# Patient Record
Sex: Female | Born: 1935 | Race: White | Hispanic: No | Smoking: Never smoker
Health system: Southern US, Community
[De-identification: ages and names within clinical notes are randomized; demographics above are authoritative.]

## PROBLEM LIST (undated history)

## (undated) DIAGNOSIS — I219 Acute myocardial infarction, unspecified: Secondary | ICD-10-CM

## (undated) DIAGNOSIS — K5790 Diverticulosis of intestine, part unspecified, without perforation or abscess without bleeding: Secondary | ICD-10-CM

## (undated) DIAGNOSIS — I214 Non-ST elevation (NSTEMI) myocardial infarction: Secondary | ICD-10-CM

## (undated) DIAGNOSIS — R51 Headache: Secondary | ICD-10-CM

## (undated) DIAGNOSIS — E7800 Pure hypercholesterolemia, unspecified: Secondary | ICD-10-CM

## (undated) DIAGNOSIS — F0390 Unspecified dementia without behavioral disturbance: Secondary | ICD-10-CM

## (undated) DIAGNOSIS — I10 Essential (primary) hypertension: Secondary | ICD-10-CM

## (undated) DIAGNOSIS — N189 Chronic kidney disease, unspecified: Secondary | ICD-10-CM

## (undated) DIAGNOSIS — K219 Gastro-esophageal reflux disease without esophagitis: Secondary | ICD-10-CM

## (undated) DIAGNOSIS — I2510 Atherosclerotic heart disease of native coronary artery without angina pectoris: Secondary | ICD-10-CM

## (undated) DIAGNOSIS — I495 Sick sinus syndrome: Secondary | ICD-10-CM

## (undated) DIAGNOSIS — I119 Hypertensive heart disease without heart failure: Secondary | ICD-10-CM

## (undated) DIAGNOSIS — R519 Headache, unspecified: Secondary | ICD-10-CM

## (undated) HISTORY — PX: COLECTOMY: SHX59

## (undated) HISTORY — DX: Acute myocardial infarction, unspecified: I21.9

## (undated) HISTORY — DX: Gastro-esophageal reflux disease without esophagitis: K21.9

## (undated) HISTORY — PX: CHOLECYSTECTOMY: SHX55

## (undated) HISTORY — DX: Non-ST elevation (NSTEMI) myocardial infarction: I21.4

## (undated) HISTORY — PX: VASCULAR SURGERY: SHX849

## (undated) HISTORY — DX: Hypertensive heart disease without heart failure: I11.9

## (undated) HISTORY — DX: Atherosclerotic heart disease of native coronary artery without angina pectoris: I25.10

## (undated) HISTORY — DX: Headache, unspecified: R51.9

## (undated) HISTORY — DX: Headache: R51

## (undated) HISTORY — PX: KNEE SURGERY: SHX244

## (undated) HISTORY — DX: Chronic kidney disease, unspecified: N18.9

## (undated) HISTORY — DX: Pure hypercholesterolemia, unspecified: E78.00

## (undated) HISTORY — DX: Diverticulosis of intestine, part unspecified, without perforation or abscess without bleeding: K57.90

---

## 1997-12-14 ENCOUNTER — Emergency Department (HOSPITAL_COMMUNITY): Admission: EM | Admit: 1997-12-14 | Discharge: 1997-12-14 | Payer: Self-pay | Admitting: Emergency Medicine

## 1997-12-15 ENCOUNTER — Encounter: Payer: Self-pay | Admitting: Emergency Medicine

## 1999-08-09 ENCOUNTER — Encounter: Admission: RE | Admit: 1999-08-09 | Discharge: 1999-08-09 | Payer: Self-pay | Admitting: Endocrinology

## 1999-08-09 ENCOUNTER — Encounter: Payer: Self-pay | Admitting: Endocrinology

## 2000-03-16 ENCOUNTER — Ambulatory Visit (HOSPITAL_COMMUNITY): Admission: RE | Admit: 2000-03-16 | Discharge: 2000-03-16 | Payer: Self-pay | Admitting: Internal Medicine

## 2000-08-09 ENCOUNTER — Encounter: Payer: Self-pay | Admitting: Internal Medicine

## 2000-08-09 ENCOUNTER — Encounter: Admission: RE | Admit: 2000-08-09 | Discharge: 2000-08-09 | Payer: Self-pay | Admitting: Internal Medicine

## 2001-07-01 ENCOUNTER — Encounter: Payer: Self-pay | Admitting: Emergency Medicine

## 2001-07-01 ENCOUNTER — Emergency Department (HOSPITAL_COMMUNITY): Admission: EM | Admit: 2001-07-01 | Discharge: 2001-07-01 | Payer: Self-pay | Admitting: Emergency Medicine

## 2001-08-01 ENCOUNTER — Encounter: Admission: RE | Admit: 2001-08-01 | Discharge: 2001-08-01 | Payer: Self-pay | Admitting: Internal Medicine

## 2001-08-01 ENCOUNTER — Encounter: Payer: Self-pay | Admitting: Internal Medicine

## 2002-01-23 HISTORY — PX: PACEMAKER INSERTION: SHX728

## 2002-08-07 ENCOUNTER — Encounter: Admission: RE | Admit: 2002-08-07 | Discharge: 2002-08-07 | Payer: Self-pay | Admitting: Internal Medicine

## 2002-08-07 ENCOUNTER — Encounter: Payer: Self-pay | Admitting: Internal Medicine

## 2002-09-07 ENCOUNTER — Encounter: Payer: Self-pay | Admitting: Emergency Medicine

## 2002-09-07 ENCOUNTER — Inpatient Hospital Stay (HOSPITAL_COMMUNITY): Admission: EM | Admit: 2002-09-07 | Discharge: 2002-09-11 | Payer: Self-pay | Admitting: Emergency Medicine

## 2002-09-09 HISTORY — PX: CARDIAC CATHETERIZATION: SHX172

## 2002-10-10 ENCOUNTER — Inpatient Hospital Stay (HOSPITAL_COMMUNITY): Admission: EM | Admit: 2002-10-10 | Discharge: 2002-10-11 | Payer: Self-pay | Admitting: Emergency Medicine

## 2002-10-10 ENCOUNTER — Encounter: Payer: Self-pay | Admitting: Emergency Medicine

## 2002-10-19 ENCOUNTER — Encounter: Payer: Self-pay | Admitting: Cardiology

## 2002-10-19 ENCOUNTER — Inpatient Hospital Stay (HOSPITAL_COMMUNITY): Admission: EM | Admit: 2002-10-19 | Discharge: 2002-10-25 | Payer: Self-pay | Admitting: Emergency Medicine

## 2002-10-19 ENCOUNTER — Encounter: Payer: Self-pay | Admitting: Emergency Medicine

## 2004-04-01 ENCOUNTER — Emergency Department (HOSPITAL_COMMUNITY): Admission: EM | Admit: 2004-04-01 | Discharge: 2004-04-01 | Payer: Self-pay | Admitting: Emergency Medicine

## 2005-08-03 ENCOUNTER — Encounter: Admission: RE | Admit: 2005-08-03 | Discharge: 2005-08-03 | Payer: Self-pay | Admitting: Neurology

## 2006-01-25 ENCOUNTER — Emergency Department (HOSPITAL_COMMUNITY): Admission: EM | Admit: 2006-01-25 | Discharge: 2006-01-25 | Payer: Self-pay | Admitting: Emergency Medicine

## 2010-02-11 ENCOUNTER — Inpatient Hospital Stay (HOSPITAL_COMMUNITY)
Admission: EM | Admit: 2010-02-11 | Discharge: 2010-02-13 | Payer: Self-pay | Source: Home / Self Care | Attending: Internal Medicine | Admitting: Internal Medicine

## 2010-02-12 NOTE — H&P (Signed)
Felicia West, Felicia West NO.:  [id number]  MEDICAL RECORD NO.:  [id number]          PATIENT TYPE:  EMS  LOCATION:  ED                           FACILITY:  [facility]  PHYSICIAN:  Ruthy Dick, MD    DATE OF BIRTH:  [birth year]  DATE OF ADMISSION:  02/11/2010 DATE OF DISCHARGE:                             HISTORY & PHYSICAL   The patient is seen and examined in the emergency room.  Primary care physician is Dr. Georgann Housekeeper.  REASON FOR ADMISSION:  Mild altered mental status, generalized weakness, and nausea today.  HISTORY OF PRESENTING ILLNESS:  Felicia West is a pleasant 77 year old lady with past medical history significant for dyslipidemia, hypertension, history of MI, and pacemaker, who was brought to the hospital today because she started feeling bad at home.  Basically, the patient's son says that the patient called her around early morning and said that around 4 a.m., she started feeling very bad.  She was not very sure about the reason she was feeling bad and the son said that he went over to her house immediately and at the house, the son thought that the patient was a little bit confused and was not looking very good. Because of this, the son called the EMS.  During this time, the patient admitted to having some nausea and general abdominal discomfort, but no pain.  In any case, prior to the arrival of the EMS vehicle, the patient had a very large bowel movement and at that time noted that she was beginning to feel good.  Confusion was still relatively there, but she had no weakness whatsoever and she had no issues with speech.  Her speech was clear and the son said that the patient actually walked to the EMS vehicle and was taken to the hospital.  Here in the hospital, the patient had another bowel movement and said that she was beginning to feel better.  Her condition also started improving according to the son.  Here in the emergency room, she had 2  episodes of hypotension with systolic blood pressure being around 80.  Of note is the fact that with all this going on, the patient did not have any chest pain and no shortness of breath.  Again, she denies having any abdominal pain.  When asked specifically today, she does not know exactly what year it is, but she knows where she is.  The son says that, that happens from time to time.  The patient says that she has had her good days and her bad days. The patient, however, lives alone and I discussed this with the family members and they said the patient has been very persistent that she would like to live independently.  A CAT scan of the abdomen was done because of the persistent nausea and the patient was found to have possible colitis, ischemic versus inflammatory versus infectious.  The patient has not had any diarrhea, even though she had 2 bowel movements today and abdomen is not tender.  PAST MEDICAL HISTORY: 1. Dyslipidemia. 2. History of a permanent pacemaker. 3. Hypertension. 4. Myocardial infarction. 5.  Questionable dementia.  The son says in the last 3 months, the     patient's memory has been going down gradually.  SOCIAL HISTORY:  The patient lives alone.  Denies tobacco, alcohol, and illicit drug use.  MEDICATIONS:  The patient is on: 1. Mirtazapine 15 mg daily at bedtime as needed. 2. Tramadol 50 mg every 6 hours as needed for pain. 3. Norvasc 5 mg every evening. 4. Avapro 300 mg every evening. 5. Vitamin D3 1000 units p.o. daily. 6. Metoprolol XL 25 mg daily. 7. Calcium 600 mg daily. 8. Vitamin E 400 international units daily. 9. Aspirin enteric-coated 325 mg daily.  ALLERGIES: 1. PENICILLIN. 2. LIDOCAINE. 3. PROCAINE. 4. CODEINE. 5. FOOD ALLERGY. 6. DAIRY ALLERGY. 7. EGG ALLERGY.  REVIEW OF SYSTEMS:  All systems reviewed, but negative except noted in history of presenting illness.  Additionally, the patient denies syncope, palpitations, diplopia,  photophobia.  The patient also denies dysuria, frequency, and urgency.  Denies melenic stool and hematochezia. Denies vomiting, but did have nausea as previously noted.  No fever, no chills.  No new skin rash.  FAMILY HISTORY:  Noncontributory.  The patient is unable to remember exactly medical problems for the family members.  PHYSICAL EXAMINATION:  GENERAL:  The patient seen and examined in the emergency room.  She is oriented to place and person but not to time, but again this seems to be almost baseline for her.  She knows who the president of this country is. VITAL SIGNS:  Temperature is 97.4, pulse 72-110, respiration 18, blood pressure 122/48, saturating 96% on room air.  Again of note is the fact that at one point during this admission, she had a systolic blood pressure of around 84. HEENT:  Normocephalic, atraumatic.  Pupils equal, round, reactive to light.  Extraocular muscles intact.  Nares patent. NECK:  Supple.  No JVD.  No lymphadenopathy.  No thyromegaly. CHEST:  Clear to auscultation bilaterally.  No rhonchi, no rales, no wheezing. ABDOMEN:  Soft, nontender.  Normal bowel sounds.  No hepatosplenomegaly. EXTREMITIES:  No clubbing or cyanosis.  No edema. CARDIOVASCULAR:  First and second heart sounds only. CENTRAL NERVOUS SYSTEM:  No obvious focal deficits noted.  LABORATORY DATA AND INVESTIGATIONS:  WBC 10.8, hemoglobin 12, hematocrit 37, platelets 164, and 81% neutrophils.  Glucose 142.  Sodium 140, potassium 4.3, BUN 23, creatinine 1.4.  Urinalysis shows no evidence for urinary tract infection.  Lactic acid is 3.9.  CT scan of the abdomen and pelvis shows limited examination without IV contrast.  However, there is diffuse mild circumferential wall thickening seen in the distal sigmoid colon with proximal rectum suggesting flexion, infectious, inflammatory, or ischemic changes.  There was also a hiatal hernia and neurolytic or disease progress may be related to  underdistention. Consideration for esophagram was suggested.  I would defer this to the attending physician in the morning.  Next, there was incomplete evaluated hypoattenuating lesion in the right hepatic lobe and right kidney.  Non-imaging ultrasound may be of use for further evaluation. EKG showed pacemaker spikes with no acute ST abnormalities.  ASSESSMENT: 1. Transient altered mental status, improved, etiology unclear. 2. Generalized weakness, improving. 3. Colitis, ischemic versus inflammatory. 4. Transient hypotension. 5. Dehydration. 6. Dyslipidemia. 7. History of a permanent pacemaker placement. 8. History of hypertension. 9. History of myocardial infarction. 10.Questionable dementia. 11.Right hepatic lobe and right kidney lesions, query etiology.  PLAN OF CARE:  The patient will be admitted to the hospital and we will hydrate the patient  judiciously.  We will place on telemetry because of history of hypotension.  We will start IV antibiotics, Cipro and Flagyl for the patient's colitis.  We would defer to rounding attending or Dr. Donette Larry to decide whether to consult Gastroenterology as far as the colitis is concerned.  We will also defer to them to also decide on how to approach the patient's hiatal hernia and the underdistention of the esophagus and also whether an esophagram will be warranted.  We will also defer to the patient's primary care physician or rounding attending to also address the patient's right hepatic lobe lesion and right kidney lesion since a nonemergent ultrasound was recommended.  Discussed with the patient and her family members that the patient is DNI/DNR and has a living will to this effect.  Plan of care discussed with them at length and they are in agreement and plan to comply.  Total time used on the patient is 1 hour.     Ruthy Dick, MD     GU/MEDQ  D:  02/11/2010  T:  02/11/2010  Job:  [id number]  cc:   Georgann Housekeeper, MD Fax:  [phone]  Electronically Signed by Ruthy Dick  on 02/12/2010 11:23:23 AM

## 2010-02-14 LAB — DIFFERENTIAL
Basophils Relative: 0 % (ref 0–1)
Lymphocytes Relative: 10 % — ABNORMAL LOW (ref 12–46)
Lymphs Abs: 1.1 10*3/uL (ref 0.7–4.0)
Monocytes Relative: 8 % (ref 3–12)
Neutro Abs: 8.8 10*3/uL — ABNORMAL HIGH (ref 1.7–7.7)
Neutrophils Relative %: 81 % — ABNORMAL HIGH (ref 43–77)

## 2010-02-14 LAB — CBC
MCHC: 32.7 g/dL (ref 30.0–36.0)
MCHC: 33.1 g/dL (ref 30.0–36.0)
MCV: 89.5 fL (ref 78.0–100.0)
Platelets: 164 10*3/uL (ref 150–400)
Platelets: 141 10*3/uL — ABNORMAL LOW (ref 150–400)
RDW: 13.3 % (ref 11.5–15.5)
RDW: 13.1 % (ref 11.5–15.5)
WBC: 10.8 10*3/uL — ABNORMAL HIGH (ref 4.0–10.5)
WBC: 9.8 10*3/uL (ref 4.0–10.5)

## 2010-02-14 LAB — BASIC METABOLIC PANEL
BUN: 17 mg/dL (ref 6–23)
CO2: 26 mEq/L (ref 19–32)
Chloride: 106 mEq/L (ref 96–112)
Glucose, Bld: 152 mg/dL — ABNORMAL HIGH (ref 70–99)
Potassium: 4.3 mEq/L (ref 3.5–5.1)

## 2010-02-14 LAB — URINALYSIS, ROUTINE W REFLEX MICROSCOPIC
Hgb urine dipstick: NEGATIVE
Protein, ur: NEGATIVE mg/dL
Urobilinogen, UA: 0.2 mg/dL (ref 0.0–1.0)

## 2010-02-14 LAB — HEPATIC FUNCTION PANEL
AST: 24 U/L (ref 0–37)
Albumin: 3 g/dL — ABNORMAL LOW (ref 3.5–5.2)
Alkaline Phosphatase: 76 U/L (ref 39–117)
Total Bilirubin: 0.9 mg/dL (ref 0.3–1.2)

## 2010-02-14 LAB — POCT I-STAT, CHEM 8
Calcium, Ion: 1.06 mmol/L — ABNORMAL LOW (ref 1.12–1.32)
HCT: 37 % (ref 36.0–46.0)
Hemoglobin: 12.6 g/dL (ref 12.0–15.0)
Sodium: 140 mEq/L (ref 135–145)
TCO2: 26 mmol/L (ref 0–100)

## 2010-02-14 LAB — POCT CARDIAC MARKERS
CKMB, poc: 1 ng/mL (ref 1.0–8.0)
Myoglobin, poc: 193 ng/mL (ref 12–200)

## 2010-02-15 LAB — URINE CULTURE
Colony Count: NO GROWTH
Culture: NO GROWTH

## 2010-03-01 NOTE — Discharge Summary (Signed)
NAMEJONA, ERKKILA                ACCOUNT NO.:  [id number]  MEDICAL RECORD NO.:  [id number]          PATIENT TYPE:  INP  LOCATION:  1436                         FACILITY:  [facility]  PHYSICIAN:  Rosalyn Gess. Laural Eiland, MD  DATE OF BIRTH:  1916-11-29  DATE OF ADMISSION:  02/11/2010 DATE OF DISCHARGE:  02/13/2010                              DISCHARGE SUMMARY   ADMITTING DIAGNOSES: 1. Transient altered mental status. 2. Generalized weakness. 3. Colitis. 4. Transient hypotension. 5. Dehydration. 6. Dyslipidemia. 7. Sick sinus syndrome with permanent pacemaker in place. 8. Hypertension. 9. History of myocardial infarction. 10.Question of mild early dementia. 11.Right hepatic lobe and right kidney lesion.  DISCHARGE DIAGNOSES: 1. Change in altered mental status. 2. Generalized weakness. 3. Colitis. 4. Change in hypotension. 5. Dehydration. 6. Dyslipidemia. 7. Sick sinus syndrome with permanent pacemaker in place. 8. Hypertension. 9. History of myocardial infarction. 10.Question of mild early dementia. 11.Right hepatic lobe and right kidney lesion.  CONSULTANTS:  None.  PROCEDURES: 1. Acute abdominal film January 20 which showed stable chronic     findings with no acute abnormality. 2. CT of the head without contrast which revealed chronic     microvascular disease with no acute abnormalities. 3. CT scan of the abdomen and pelvis which revealed diffuse mild     circumferential wall thickening in the distal sigmoid colon,     proximal rectum suggesting inflammation or infection.  The patient     had hiatal hernia with nodular thickening of the distal esophagus.     Incompletely evaluated hypoattenuating lesions in the right hepatic     lobe and right kidney.  Nonemergent ultrasound is considered a     reasonable follow-up study.  HISTORY OF PRESENT ILLNESS:  Ms. Sayegh is a 77 year old woman who lives independently, but her family looks in her on regular basis.  She is  followed for dyslipidemia, hypertension, CAD, sick sinus syndrome with a history of pacemaker.  The patient's son was a historian for her admission.  Early in the a.m. of the day of admission, she started feeling bad.  She was nonspecific in regards to her complaints.  Her son felt that she was a bit confused, more than usual and was looking ill. So later in the day, the son called EMS and brought the patient to the emergency department.  Prior to arrival of the EMS, the patient reportedly had large bowel movement and at that time she reported she was feeling better.  She remained confused.  By the time she arrived with EMS, she was back to her normal mental status.  She had a second bowel movement in the emergency department.  There was no blood noticed. The patient had transient episode of hypotension in admission with systolic dropping to 80.  Initial studies revealed the patient to have circumferential wall thickening of the distal colon suggestive of colitis.  The patient was subsequently admitted for management of these problems.  Please see the H and P for past medical history, social history, medication listing.  PHYSICAL EXAMINATION ON ADMISSION:  VITAL SIGNS:  Revealed the patient to have a temperature 97.4,  blood pressure was 122/48, O2 sat was 96%. ABDOMEN:  Soft, nontender with normal bowel sounds with no tenderness.  ADMISSION LABORATORY:  White count 10,800, hemoglobin 12 g, platelet count 164,000.  The patient had normal differential.  Chemistries were unremarkable with a BUN 23 and creatinine 1.4.  UA was negative.  Lactic acid was elevated at 3.9.  Imaging studies as noted.  HOSPITAL COURSE: 1. Mental status changes.  The patient returned to her baseline.     However on the day of discharge, the patient awoke early in the     morning and had 2-hour period of confusion and disorientation.  She     was able to return to sleep and when she next awoke, she was      oriented and at her mental status baseline.  The patient's vital     signs remained stable.  She had no focal complaints or problems. 2. Dehydration.  The patient was felt to be dehydrated.  She was given     IV fluids to rehydrate.  She was tolerating this well with no     specific complaints.  Orthostatic vital signs were not obtained.  PLAN: 1. The patient is encouraged at time of discharge to take adequate     fluid being somewhat between 24 and 32 ounces a day.  Will arrange     for home health RN visit to monitor the patient's medication     compliance as well as fluid status, watch her carefully for any     signs of further dehydration. 2. GI,  the patient with probable inflammatory bowel representing     acute infection.  She does have a history of diverticulitis in the     past.  The patient was put on Cipro and Flagyl which was converted     to oral medications on the 21st.  She has continued to do well with     taking her oral medications.  Will continue this for 5 additional     days. 3. Disposition.  The patient was seen by Physical Therapy in this     admission and was felt that she would benefit from home health    physical therapy at discharge.  The patient herself is not     interested in any form of assisted living. 4. With the patient being stable, her blood pressure being stable with     her being rehydrated, with her tolerating oral medications at this     time, she is felt to be stable and ready for discharge home.  Her     family is in agreement with this plan.  Family members were looking     in her on a regular basis.  Will arrange for home health nursing to     see the patient three times a week for medication adherence as well     as to monitor for signs of dehydration, specifically taking     orthostatic vital signs in each visit.  Will have home health PT     see the patient and evaluate her and treat as needed.  Will have     home health perform an in-home  evaluation and assessment for fall     risk and recommendations for falls safety.  DISCHARGE EXAMINATION:  VITAL SIGNS:  Temperature was 98, blood pressure 152/80, pulse was 67, respirations were 18, O2 sats 93% on room air. GENERAL APPEARANCE:  This is an elderly woman, sitting  in the chair, in no acute distress. HEENT EXAM:  Grossly unremarkable. CHEST:  The patient is moving air well.  No rales, wheezes, or rhonchi were appreciated. CARDIOVASCULAR:  A 2+ radial pulse.  The patient's precordium was quiet. Heart sounds were distant but regular. ABDOMEN:  Soft without guarding or rebound. EXTREMITIES:  Without edema.  FINAL LABORATORY:  Urine culture from admission was no growth at discharge.  Metabolic panel on 20th, day of admission with a sodium 140, potassium 4.3, chloride of 106, CO2 of 26, BUN of 17, creatinine 1.29, glucose 152.  CBC at admission with a white count 9800, hemoglobin 10.5 g, platelets count 141,000.  Differential was unremarkable.  Urinalysis at admission showed specific gravity of 1.018.  Urine was hazy.  Urine was negative by dipstick for blood, nitrite, or leukocytes.  Lipase at admission was normal at 15.  Liver functions were normal.  Cardiac markers at admission were normal.  DISPOSITION:  The patient is discharged to home.  Family will be in attendance today.  We will ask for home health to pick up the patient on Monday the 23rd.  The patient's medications will remain the same with the addition of Flagyl and ciprofloxacin.  She is provided with medication discharge manager printout.  The patient's condition at time of discharge dictation is guarded given her advanced age.  The patient does have followup appointment with Dr. Georgann Housekeeper on February 2.  She is encouraged to keep this appointment.  She also has a followup appointment scheduled with Dr. Armanda Magic for cardiology.  Thank you again for you assistance.     Rosalyn Gess Jamez Ambrocio,  MD     MEN/MEDQ  D:  02/13/2010  T:  02/13/2010  Job:  [id number]  cc:   Georgann Housekeeper, MD Fax: [phone]  Armanda Magic, M.D. Fax: [phone]  Electronically Signed by Illene Regulus MD on 03/01/2010 07:24:59 AM
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
## 2010-06-10 NOTE — Op Note (Signed)
NAME:  Felicia West, Felicia West                          ACCOUNT NO.:  [id number]   MEDICAL RECORD NO.:  [id number]                   PATIENT TYPE:  INP   LOCATION:  4702                                 FACILITY:  [facility]   PHYSICIAN:  Veneda Melter, M.D.                   DATE OF BIRTH:  1916-11-10   DATE OF PROCEDURE:  10/21/2002  DATE OF DISCHARGE:                                 OPERATIVE REPORT   PROCEDURE PERFORMED:  1. Aortic arch angiogram.  2. Bilateral carotid angiogram.  3. Bilateral vertebral angiogram.   DIAGNOSES:  1. Cerebrovascular disease.  2. Syncope.   HISTORY:  Felicia West is an 77 year old white female who presents with  recurrent syncope.  The patient was admitted to the hospital, stabilized  medically.  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   DESCRIPTION OF PROCEDURE:  Informed consent was obtained.  The patient was  taken to the peripheral vascular cath lab.  A 5 French sheath placed in the  risk factors using modified Seldinger technique.  Subsequently a 5 French  pigtail catheter was advanced to the aortic arch.  The aortic arch angiogram  performed using power injections of contrast.  This showed a type 3 arch  with steep take off of the right brachiocephalic, left carotid and left  subclavian arteries from the ascending arch.  There was mild atherosclerotic  disease and moderate calcification.  Subsequently several catheters were  then used to perform selective angiography of the carotid arteries.  A  catheter was also placed in the left subclavian artery nonselective  opacification of the left vertebral artery performed.  Due to difficulty in  placing the catheter in the right subclavian artery due to tortuosity and  calcification of the brachiocephalic artery and bifurcation, a 5 French  sheath was placed in the  right brachial artery and an IM  catheter  positioned in the subclavian vessel.  This was used to perform nonselective  opacification of the vertebral artery on the right.  At the termination of  the case, catheters and sheath were removed and manual pressure applied til  good hemostasis achieved.  The patient tolerated the procedure well and was  transferred to the floor in stable condition.   FINDINGS:  1. Right vertebral artery.  This is a small caliber vessel that tapers in     its mid section.  No significant contribution to posterior circulation     noted.  2. Right coronary artery was patent.  There was mild disease of 30% at the     bifurcation.  3. Left carotid artery was patent.  There is mild disease at 30% at the     bifurcation in the internal segment.  4. Left vertebral artery is patent.  It  is a large caliber vessel.  It is     dominant.  It provides flow to the basilar artery with filling of the     posterior circulation bilaterally.  No competitive flow is noted from the     right vertebral artery.  The left vertebral artery has moderate     tortuosity in the proximal segment.  There is dense stenosis of 50% after     a bend in the vessel.  The midsection of the vessel has mild disease at     20 to 30%.  Intracranial flow is unimpeded.    ASSESSMENT/PLAN:  Felicia West is an 77 year old female with recurrent  syncope.  She has a dominant left vertebral artery with intact posterior  circulation.  She has mild disease in this vertebral artery that may be  treated medically and other causes of syncope investigated.                                               Veneda Melter, M.D.    NG/MEDQ  D:  10/21/2002  T:  10/21/2002  Job:  [id number]   cc:   Doylene Canning. Ladona Ridgel, M.D.   Catherine A. Orlin Hilding, M.D.  [address]  [address]  [place]  [place] [address]  Fax: [phone]

## 2010-06-10 NOTE — Discharge Summary (Signed)
   NAMEDELESA, KAWA                          ACCOUNT NO.:  [id number]   MEDICAL RECORD NO.:  [id number]                   PATIENT TYPE:  INP   LOCATION:  2037                                 FACILITY:  [facility]   PHYSICIAN:  Georgann Housekeeper, M.D.                 DATE OF BIRTH:  Feb 15, 1916   DATE OF ADMISSION:  09/07/2002  DATE OF DISCHARGE:  09/11/2002                                 DISCHARGE SUMMARY   DISCHARGE DIAGNOSES:  1. Chest pain.  2. Non-Q-wave myocardial infarction.  3. Hypertension.   CONDITION ON DISCHARGE:  Stable.   DISCHARGE MEDICATIONS:  1. Norvasc 5 mg daily.  2. Crestor 10 mg daily.  3. Lopressor 25 mg half tablet b.i.d.  4. Aspirin 325 daily.   FOLLOW UP:  One to two weeks with Dr. Donette Larry and follow up with Dr. Mayford Knife,  cardiology, in about two weeks.   CONSULTATIONS:  Cardiology.   PROCEDURES:  1. Cardiac catheterization.  2. Cardiolite stress test.   Please see H&P for details.   HISTORY OF PRESENT ILLNESS:  The patient is 77 years old with history of  hypertension and hyperlipidemia, __________ disease, admitted with back and  chest discomfort and question of syncope at home.   HOSPITAL COURSE:  The patient was admitted to telemetry.  Her cardiac  enzymes were positive with troponin up to 1.93, normal being less than 0.05.  CK peaked at 150s.  The patient was on Lovenox, nitroglycerin, beta-blocker  and aspirin.  She did not have any more discomfort in the hospital.  Her  other chemistries and renal function was normal.  Blood pressure remained  stable.  The patient was seen by cardiology and underwent cardiac  catheterization which revealed a borderline obstructive coronary artery  disease with 20% LAD and distal RCA 60% over all the branches.  LV function  was normal.  The patient underwent Cardiolite stress test to reevaluate for  any reversible  ischemia. Her Cardiolite stress test was negative for any reversible  ischemia.  EF was normal.   Decided for medical management.  Lopressor was  added to her regimen.  Continued on blood pressure control and lipid  management with Crestor.                                                Georgann Housekeeper, M.D.    KH/MEDQ  D:  10/09/2002  T:  10/10/2002  Job:  [id number]

## 2010-06-10 NOTE — Discharge Summary (Signed)
NAME:  Felicia West, Felicia West                          ACCOUNT NO.:  [id number]   MEDICAL RECORD NO.:  [id number]                   PATIENT TYPE:  INP   LOCATION:  4702                                 FACILITY:  [facility]   PHYSICIAN:  Armanda Magic, M.D.                  DATE OF BIRTH:  February 21, 1916   DATE OF ADMISSION:  10/19/2002  DATE OF DISCHARGE:  10/25/2002                                 DISCHARGE SUMMARY   HISTORY OF PRESENT ILLNESS AND REASON FOR ADMISSION:  This is an 77 year old  female who came in with complaints of syncope. It was noted that this was  the second hospitalization in the past ten days for syncope. She was also  experiencing chest pain and has a known history of CAD one month status post  non Q-wave MI.  Her catheterization at that time showed a normal LV function  and an adenosine Cardiolite done prior showed no ischemia, although the  catheterization did reveal borderline obstructive LAD and PDA disease. She  also has other histories of hypertension, dyslipidemia, GERD, osteoporosis,  diverticulosis, and anemia. She was admitted to the telemetry unit to rule  out MI and/or rule out arrhythmia, etc., as causes for her syncope, and also  to evaluate the chest pain again and rule out MI.   HOSPITAL COURSE:  Problem 1.  History of CAD and chest pain.  The patient  was admitted to the medical telemetry unit. Serial isoenzymes were negative.  Subsequent adenosine Cardiolite was negative for any ischemia. She showed an  overestimated EF of 80% and essentially was considered as a normal study.   Problem 2.  Syncope.  After admission the patient experienced no further  episodes of syncope spontaneously. No arrhythmias were noted with basic  telemetry monitoring and MI was negative.  Other sources for syncope were  sought to be identified. Neurology was consulted. The patient underwent an  MRI/MRA, EEG, transcranial dopplers, and all were negative for any possible  pathology to  have caused her syncope. She also underwent arteriography of  her cerebral vessels which again showed no evidence of any stenosis or any  reason for syncope.  Because of this, [place] EP physicians were consulted  and the patient underwent a tilt table study on October 23, 2002, which  demonstrated a positive head-up tilt with both cardiac inhibitor and  vasopressor syncope. Well into the test the patient  was complaining on  multiple occasions of her head feeling funny with variable heart rate. At  one time when she said her head felt funny, her blood pressure dropped to  91/41 with a heart rate of 103, and immediately following that the patient's  heart rate was less than 20.  She was unresponsive and subsequently regained  blood pressure of 145/63 with a heart rate of 67.  Based on these findings  it was determined that the patient had  neurally mediated syncope and [place]  as well as Dr. Mayford Knife discussed the options for this family due to the  positive tilt test which the indications were for that time permanent  pacemaker and/or increasing sodium intake with fluids. Family opted to  proceed with permanent pacemaker placement.  She underwent permanent  pacemaker placement on October 24, 2002, an Insignia I Intra DR, model [id number],  was inserted in this patient, serial [id number]. Her atrial lead was a Guidant  Flex-10, serial [id number]. The right ventricular lead was a Guidant Flex-  10, serial [id number]. The patient tolerated the procedure well without  any complications. Post procedure chest x-ray was negative for a  pneumothorax. A permanent pacemaker check on October 25, 2002, gave the  following readings: Sensing amp atrial 2.5 MV, ventricular greater than 12.0  MV, threshold 6 VE-O.4 MS and for the atrial and ventricular 7 VE-0.4 MS.  The impedance was 560 R on the A and 80 R on the V.  All diagnostics were  normal and this was per Gennaro Africa with Guidant [id number].   FINAL  DIAGNOSES:  1. Chest pain, noncardiac in etiology, with negative adenosine Cardiolite.     Ejection fraction was 80%.  2. Newly mediated syncope with positive tilt test, status post dual-chamber     pacemaker placement .  3. Hypertension, controlled.  4. Dyslipidemia.  5. Prior medical history of gastroesophageal reflux disease.  6. Osteoporosis.  7. Diverticulosis.  8. Anemia.   DISCHARGE MEDICATIONS:  1. Aspirin 325 mg daily.  2. Protonix 40 mg daily.  3. Toprol XL 25 mg daily.  4. Percocet 5/325 mg one to two tablets q.6h. p.r.n. pain.  5. The patient has been instructed to not take her previously prescribe     Imdur or Norvasc.   ACTIVITY:  No lifting or carrying objects or using left arm at all until  follow up with Dr. Mayford Knife. No driving for two weeks.   DIET:  Low fat, low cholesterol, low salt.   WOUND CARE:  No showering until seen by Dr. Mayford Knife. Sponge bathe only around  the pacer site. Pat dry, do not rub.   ADDITIONAL INSTRUCTIONS:  Blood pressure check at Dr. Norris Cross office on  Thursday, October 30, 2002, between 10:30 a.m. and 12:00 p.m. or 2-4 p.m.   FOLLOWUP APPOINTMENT:  She has an  appointment scheduled with Dr. Mayford Knife on  Tuesday, October 19l, 2004, at 10:30 a.m. she is to call for an  appointment  to be seen by her primary care physician, Dr. Ladona Ridgel within the next ten  days.      Allison L. Ulla Gallo, M.D.    ALE/MEDQ  D:  11/07/2002  T:  11/07/2002  Job:  [id number]

## 2010-06-10 NOTE — Op Note (Signed)
NAME:  Felicia West, Felicia West                          ACCOUNT NO.:  [id number]   MEDICAL RECORD NO.:  [id number]                   PATIENT TYPE:  INP   LOCATION:  4702                                 FACILITY:  [facility]   PHYSICIAN:  Doylene Canning. Ladona Ridgel, M.D.               DATE OF BIRTH:  Feb 14, 1916   DATE OF PROCEDURE:  10/23/2002  DATE OF DISCHARGE:                                 OPERATIVE REPORT   PROCEDURE PERFORMED:  Head up tilt table testing.   INDICATIONS:  Recurrent unexplained syncope.   INTRODUCTION:  The patient is a very pleasant 77 year old woman with a  history of very mild nonobstructive coronary disease and preserved LV  function who had multiple episodes of syncope in the last several weeks.  She has undergone extensive neurologic evaluation as well as cardiac  evaluation with preserved LV function, nonobstructive coronary disease, and  MRI and cerebral angiography demonstrating no critically narrowed vertebral  arteries.  She is now referred for head up tilt table testing for additional  evaluation.   PROCEDURE:  After informed consent was obtained, the patient was taken to  the diagnostic catheterization lab in the fasting state.  After the usual  preparation she was placed in the supine position.  The blood pressure was  initially 160/70 and the pulse was 76.  She was placed in the 70-degree head-  up position for approximately 35 minutes.  Initially her blood pressure  dropped from 150s and 160s down to the 130s with heart rate increased from  the mid 70s to the mid 80s.  Her heart rate and blood pressure remained  fairly stable.  At 20 minutes into tilting, the patient's blood pressure  dropped from the 140s into the 130s and her heart rate increased from 100 to  110.  At 38 minutes in the tilting, the blood pressure dropped down to 90/40  and the heart rate dropped to 108.  At that point she said that she began to  feel funny and she became unresponsive.  Her heart  rate was less than 20  beats per minute.  There was positive of approximately 5 seconds.  She was  immediately placed in the supine position and given a bolus if IV fluids and  Atropine and returned to the supine position.  The blood pressure  immediately normalized to 140/60 and the pulse increased back into the 60s.  The patient felt very poorly for several minutes but then was returned to  her room in satisfactory condition.   COMPLICATIONS:  There were no immediate procedure complications.   RESULTS:  This demonstrates a positive head up tilt table test in an 86-year-  old woman with recurrent syncope.  I will discuss the treatment options with  the patient and her family.  Doylene Canning. Ladona Ridgel, M.D.    GWT/MEDQ  D:  10/23/2002  T:  10/23/2002  Job:  [id number]   cc:   Armanda Magic, M.D.  [address]  [place], [place] [address]  Fax: [phone]

## 2010-06-10 NOTE — H&P (Signed)
NAME:  Felicia West, Felicia West                          ACCOUNT NO.:  [id number]   MEDICAL RECORD NO.:  [id number]                   PATIENT TYPE:  INP   LOCATION:  3734                                 FACILITY:  [facility]   PHYSICIAN:  Georgann Housekeeper, M.D.                 DATE OF BIRTH:  Jan 01, 1917   DATE OF ADMISSION:  10/10/2002  DATE OF DISCHARGE:  10/11/2002                                HISTORY & PHYSICAL   CHIEF COMPLAINT:  Passing out.   HISTORY OF PRESENT ILLNESS:  [age]-year-old female with history of  coronary artery disease, non-Q MI in August of 2004, hypertension,  hyperlipidemia, acid reflux disease.  The patient says she had a little bit  of unable to sleep last night because she felt a little tired and her heart  was a little beating, she felt that, without any chest pain or shortness of  breath and she woke up this morning and she was feeling fine and went up to  the hairdresser and as she got her hair washed and she was sitting under the  dryer, she felt she started feeling dizzy and little lightly and did not  feel good and passed out; the hairdressers called 911.  She woke up,  remembered everything and called her son.  The patient had a little bit of  dizziness and grogginess when she came to the emergency room and felt a  little nausea but no chest pain.  She had a little bit of back pain, no  palpitations or shortness of breath.  Initially, the EMS told a blood  pressure of 80/50 but in the ER, blood pressure was 120 to 130 systolic.  The patient was seen by cardiology, Dr. Armanda Magic, a week ago and was  changed from metoprolol to Coreg; she was not taking the metoprolol and was  just started on Coreg 6.25 mg b.i.d. and also on Imdur 30 mg; those are the  new recent medications.   ALLERGIES:  PENICILLIN, CODEINE and LIDOCAINE.   MEDICATIONS:  1. Aspirin one a day.  2. Multivitamin.  3. Vitamin E.  4. Norvasc 4 mg daily.  5. Imdur 30 mg daily.  6. Protonix  40 mg daily.  7. Zocor 20 mg daily.  8. Coreg 6.25 mg b.i.d.   MEDICAL PROB ELMS:  1. Non-obstructive coronary artery disease.  She had a catheterization in     August of 2004, a month ago, EF normal.  Cardiolite stress test was no     reversible ischemia.  2. Gastroesophageal reflux disease.  3. Hypertension.  4. Osteoporosis.  5. Hyperlipidemia.  6. Constipation.  7. Diverticulosis.   SURGICAL HISTORY:  1. Status post gallbladder surgery.  2. Status post colectomy for diverticulosis.   FAMILY HISTORY:  Father died of colon cancer at 32.  Brother died of stroke.  Mother died of pneumonia.   SOCIAL HISTORY:  No  tobacco, no alcohol.  Live in home, widowed; two sons.   REVIEW OF SYSTEMS:  No fevers; no cough; no diarrhea; no headache.   PHYSICAL EXAMINATION:  VITAL SIGNS:  On exam, temperature 97, blood pressure  120/80, pulse 60s, regular, respirations 16, 98% on room air.  GENERAL:  Generally awake and alert, in no acute distress, felt a little  lightheaded.  LUNGS:  Lungs were clear.  HEENT:  Oropharynx was clear.  NECK:  No bruits.  No thyromegaly.  CARDIAC:  Exam revealed S1 and S2 without any murmurs.  ABDOMEN:  Abdomen was soft without any tenderness.  Good bowel sounds.  EXTREMITIES:  No edema.  NEUROLOGIC:  Exam was nonfocal, alert and oriented.  Strength was intact.  Reflexes symmetrical.  Cranial nerves intact.   LABORATORY DATA:  White count 7.6, hemoglobin was 12.  Chemistry:  Sodium  138, potassium 3.7, BUN of 14, creatinine 1.2, glucose 120.  CK-MB 1.5,  troponin less than 0.0.   Head CT was negative.   Chest x-ray negative.   EKG:  Normal sinus rhythm without any changes.   IMPRESSION:  [age]-year-old female with a history of nonobstructive  coronary artery disease, non-Q myocardial infarction in August 2004,  recently started with new medications, Coreg and Imdur, episode of syncope  at hairdresser.   Syncope:  Admit to telemetry, rule out  cardiac etiology; I will get some  enzymes and rule out any arrhythmia.  Neurologically nonfocal.  Most likely  related to the medication, Coreg/beta blocker, and I would hold that;  continue her Norvasc and Imdur.  If she rules out and she does okay, we  might get her home.  No need for any further cardiac workup.  If looks okay,  I will put her on Lovenox and continue on aspirin.  If she has any more  further episode or positive enzymes, we will get cardiology involved, get an  ultrasound checked for carotids.                                                 Georgann Housekeeper, M.D.    KH/MEDQ  D:  10/10/2002  T:  10/12/2002  Job:  [id number]

## 2010-06-10 NOTE — Discharge Summary (Signed)
NAME:  Felicia West, Felicia West                          ACCOUNT NO.:  [id number]   MEDICAL RECORD NO.:  [id number]                   PATIENT TYPE:  INP   LOCATION:  3734                                 FACILITY:  [facility]   PHYSICIAN:  Marcene Duos, M.D.         DATE OF BIRTH:  [birth year]   DATE OF ADMISSION:  10/10/2002  DATE OF DISCHARGE:  10/11/2002                                 DISCHARGE SUMMARY   ADMISSION DIAGNOSES:  1. Syncopal episode.  2. Coronary artery disease with recent non-Q-wave myocardial infarction,     August 2004.  3. Hypertension.  4. Gastroesophageal reflux disease.   DISCHARGE DIAGNOSES:  1. Syncopal episode.  2. Coronary artery disease with recent non-Q-wave myocardial infarction,     August 2004.  3. Hypertension.  4. Gastroesophageal reflux disease.   HISTORY OF PRESENT ILLNESS/HOSPITAL COURSE:  This is an 77 year old female  who is admitted through the emergency room October 10, 2002 by Dr. Tyson Dense with a history of the patient felt tired the morning of admission,  was at her hairdresser's under the dryer, did not feel well, and passed out.  She was brought by emergency personnel to the emergency room.  EKG, chest x-  ray, CT of head, were reportedly all within normal limits or stable.  CBC  and electrolytes were without concern.  Hemoglobin at 12.  Cardiac enzymes  on admission showed a troponin I of less than 0.05, CK-MB of 1.5.  The  patient gives a history that she has been unsteady or wobbly on her feet  since her discharge from the hospital for non-Q-wave MI.  The patient did  undergo Cardiolite post MI, but did not show ischemic changes.  She  apparently was discharged on Lopressor, Norvasc, and Imdur, all of which the  patient has been taking in the morning, and when she saw Dr. Mayford Knife  recently, was switched apparently from Lopressor to Coreg 6.25 mg twice  daily.  The patient however has only been taking one dose of Coreg and  again  that was in the morning as well.  She stated that soon after taking her  medications she would develop the dizziness.   HOSPITAL COURSE:  1. SYNCOPAL EPISODE:  The patient had no further episodes of syncope this     morning.  At time of discharge she states she has been up to the     bathroom, does not feel wobbly.  She has not yet had any of her     medications other than the Protonix.  She is feeling much better, denies     any palpitations, and telemetry shows sinus rhythm in the low 60s with no     arrhythmias noted overnight.  Carotid Dopplers showed mild to moderate     calcific plaque in origin of the left carotid artery, external carotid     artery, and mild calcific plaque in the origin  of the ICA.  Vertebral     artery flow was antegrade.   1. CORONARY ARTERY DISEASE:  The patient ruled out for cardiac injury and     with serial cardiac enzymes and is having no discomfort today.   1. POSSIBLE MILD THROMBOPHLEBITIS, RIGHT ARM:  The patient is noted to have     mild erythema extending from her ID site, no palpable cord there and the     skin is nontender.  No __________.  Upon removal of the IV she is told to     intermittently apply some warm compresses and to call if that should     worsen.   DISPOSITION:  The patient is thus discharged in stable condition on  October 11, 2002.  We have elected to hold her Coreg for now, have asked  her to take her Norvasc in the morning with breakfast and her Imdur at  lunch.   FOLLOW UP:  Will follow up with Dr. Eula Listen or Dr. Mayford Knife in the next week  to see how she is doing and if she is feeling stronger, consider reading  beta blocker later in the day, possibly before bedtime.  She is to call if  she develops any syncopal or presyncope symptoms.   DISCHARGE MEDICATIONS:  1. Norvasc 5 mg p.o. q.a.m.  2. Imdur 30 mg at lunch.  3. Aspirin 325 mg daily.  4. Zocor 20 mg daily.  5. Protonix 40 mg daily.   DIET:  Low  cholesterol diet.                                                     Marcene Duos, M.D.    EMM/MEDQ  D:  10/11/2002  T:  10/12/2002  Job:  [id number]

## 2010-06-10 NOTE — Consult Note (Signed)
NAME:  Felicia West, Felicia West                          ACCOUNT NO.:  [id number]   MEDICAL RECORD NO.:  [id number]                   PATIENT TYPE:  INP   LOCATION:  4702                                 FACILITY:  [facility]   PHYSICIAN:  Pramod P. Pearlean Brownie, MD                 DATE OF BIRTH:  1916/07/30   DATE OF CONSULTATION:  DATE OF DISCHARGE:                                   CONSULTATION   REFERRING PHYSICIAN:  Armanda Magic, M.D.   REASON FOR REFERRAL:  Syncope.   HISTORY OF PRESENT ILLNESS:  The patient is an 77 year old lady, who was  admitted with an episode of brief loss of consciousness yesterday.  The  history is provided by the patient's son, who was present when this  happened.  The patient had apparently gone grocery shopping with the son and  had done quite well.  As they returned to her house, her son asked her to  open the door while he went to get grocery bags from the trunk.  When he saw  that his mother had not stepped out of the car, he opened the door and found  her to be unresponsive with the head slumped forward.  Her face was ashen  and she had broken into a sweat.  Her eyes were opened and she was looking  up with a blank look on her face.  She apparently had stopped breathing.  He  put her back in the car and tried to drive back to the hospital.  A few  minutes later he stopped the car and called 911.  The patient came in  unresponsive for several minutes until the EMS got there.  She apparently  regained consciousness en route to the emergency room.  She subsequently  threw up and complained of nausea, as well as some pressure in her chest.  She has remained on telemetry monitoring since then, but not significant  cardiac arrhythmia has been found.  She had a similar episode about 10 days  ago for which she was hospitalized.  At that time, he had undergone cardiac  catheterization and was found to have some mild obstructive coronary  disease.  Adenosine Cardiolite stress  test had no revealed any significant  cardiac ischemia.  The patient apparently has had episodes in the past when  she has complained of dizziness and nausea, particularly when she is gotten  up and walked.  This has lasted a few minutes and then settled when she has  sat down.  She does have a history of a seizure disorder in the remote past.  As per her description, those episodes were similar to the present episode  of passing, however, during those episodes she had urinary incontinence, as  well as injury, which did not happen during these episodes.  She had been  treated with Dilantin, which she took for four years.  She  remained episode-  free.  She gradually tapered and discontinued the Dilantin.  She has not had  any episodes since the mid 1980s until recently.  Past neurological history  not significant for documented stroke, TIA, or migraine headaches.   PAST MEDICAL HISTORY:  1. Coronary artery disease.  2. Hypertension.  3. Hyperlipidemia.  4. Gastrointestinal reflux disease.  5. Osteoporosis.  6. Diverticulosis.  7. Anemia.   MEDICATION ALLERGIES:  1. PENICILLIN.  2. CODEINE.  3. TOPICAL LIDOCAINE.  4. NOVOCAINE.   CURRENT MEDICATIONS:  Aspirin, Zocor, nitroglycerin paste and IMDUR.   PAST SURGICAL HISTORY:  Not obtained.   REVIEW OF SYSTEMS:  Not significant for recent fever, loss of weight, cough,  diarrhea, or illness.   PHYSICAL EXAMINATION:  GENERAL APPEARANCE:  A pleasant elderly lady who is  not in distress at present.  VITAL SIGNS:  She is afebrile with a pulse rate of 66 per minute and regular  and blood pressure 118/55.  Distal pulses are felt.  HEENT:  The head is atraumatic.  ENT exam unremarkable.  NECK:  Supple without bruit.  CARDIAC:  No murmur or gallop.  LUNGS:  Clear to auscultation.  NEUROLOGIC:  She is awake, alert, and oriented x3 with normal speech and  language function.  She has intact attention and recall.  There is no  aphasia,  apraxia or apraxia.  Pupils are irregular, but reactive.  Visual  acuity and fields are adequate.  The face is symmetric.  The palate elevates  normally.  The tongue is midline.  Motor extremity exam reveals symmetrical  power and lower extremity strength, tone, reflexes, coordination, and  sensation.  Finger-to-nose coordination was accurate.  Knee-to-heel  coordination is slow, but accurate.  Her gait was not tested.   DATA REVIEWED:  CT of the head noncontrast study dated October 10, 2002,  reveals no acute abnormalities.  Recent admission and diagnostic testing  were reviewed.   IMPRESSION:  An 77 year old lady with an episode of brief loss of  consciousness, possibly a syncopal event.  The differential diagnosis also  includes vertebrobasilar transient ischemic attack, as well as a remote  history of seizures.   PLAN:  I would recommend obtaining an EEG to look for epileptiform features,  as well as transcranial Doppler studies to evaluate for intracranial  stenosis.  She has had recent carotid ultrasound studies, which have  apparently revealed only mild blockages.  She will also benefit by having an  MRI scan of the brain with MRA of the brain as well.  I would hold off on  specific anticonvulsants at the present time unless the EEG is frankly  abnormal.  I look forward to seeing her in followup on consult.  Kindly call  for questions.                                               Pramod P. Pearlean Brownie, MD    PPS/MEDQ  D:  10/20/2002  T:  10/20/2002  Job:  [id number]

## 2010-06-10 NOTE — H&P (Signed)
NAME:  Felicia West, Felicia West NO.:  [id number]   MEDICAL RECORD NO.:  [id number]                   PATIENT TYPE:  INP   LOCATION:  1829                                 FACILITY:  [facility]   PHYSICIAN:  Candyce Churn, M.D.          DATE OF BIRTH:  [birth year]   DATE OF ADMISSION:  09/07/2002  DATE OF DISCHARGE:                                HISTORY & PHYSICAL   CHIEF COMPLAINT:  1. Upper back and chest pain for three days, exertionally related.  2. Question of a syncopal episode.   HISTORY OF PRESENT ILLNESS:  Felicia West is a pleasant 78 year old very  functional female with history of (1) hypertension, (2)  hypercholesterolemia, (3) partial colectomy secondary to diverticulitis in  1989, (4) hiatal hernia and GERD, (5) status post cholecystectomy in 1980,  (6) constipation, (7) history of a kink in her colon.   She presents with approximately three days of intermittent upper back and  left shoulder blade pain which seems to start in the back and then radiate  to the lower sternal area.  It seems to occur with exertion and resolve with  rest, though she has been having some mild discomfort at rest in the  emergency room.  This is associated with nausea.  Today she had an episode  at her home where her son witnessed a transient loss of consciousness while  sitting after she said she did not feel well and looked somewhat ashen and  sat down in her recliner chair.  She apparently was unresponsive for a few  moments and became diaphoretic and nauseous.  EMS was called, and she was  transferred to the [facility] emergency room, and she has continued to have  episodic upper back and chest pain.  EKG's thus far have been within normal  limits.  Because of her age and risk factors, she is admitted to assess for  unstable angina, rule out myocardial infarction, and monitor for  arrhythmias.   ALLERGIES:  1. PENICILLIN.  2. A history of reported severe  allergies to any CAINE drugs, though she     has had oral Cetacaine for an upper endoscopy.  3. She also does not tolerate CODEINE.   MEDICATIONS:  1. Norvasc 5 mg daily.  2. Hydrochlorothiazide 12.5 mg daily.  3. Crestor question 10 mg daily.  4. Darvocet-N 100, one earlier today.  5. MiraLax last p.m.   FAMILY HISTORY:  Noncontributory.   SOCIAL HISTORY:  No tobacco use, no alcohol use.  The patient lives alone.  She is widowed.  She is very functional on her own usually.  Very interested  in cardiac intervention if necessary at this time.  Her son's, Konnor Jorden, home number is [phone], and her other son's number is [phone].   REVIEW OF SYSTEMS:  Denies headache, change in bowel or urinary habits, no  rapid increase or decrease in weight lately,  no pain in her arms.   PHYSICAL EXAMINATION:  GENERAL:  Moderately obese female in no acute  distress.  Very pleasant, alert, and oriented.  Complaint of intermittent  upper back and chest pain.  VITAL SIGNS:  Blood pressure is 137/64, pulse 73 and regular, respiratory  rate 22 and easy, temperature 96.8.  HEENT:  Atraumatic and normocephalic.  NECK:  Without JVD.  CHEST:  Clear to auscultation.  CARDIAC:  Regular rhythm without murmur, rub, or gallop.  ABDOMEN:  Soft, nontender.  EXTREMITIES:  Without clubbing, cyanosis, or edema.  NEUROLOGIC:  Oriented x3, nonfocal.  SKIN:  She has no rashes.   LABORATORY DATA:  White count 9600, hemoglobin 12.6, platelet count 200,000.  Myoglobin is increased from 130 to 372 to 500.  CK-MB is 2.1 then 3.1 then  5.4 on successive checks.  Troponin is less than 0.05 x3.  BMET is within  normal limits with a creatinine of 1.0, potassium 3.7.  LFTs are normal.  Urinalysis has greater than 80 ketones, but otherwise normal.  Chest x-ray  shows cardiomegaly, but no active disease.  EKG reveals sinus rhythm at 81  beats per minute.  She has nonspecific ST-T wave changes, low voltage   throughout.   ASSESSMENT:  1. An 77 year old female with exertion-related back and chest pain.     Question of unstable angina.  She probably had a syncopal episode which     could have been vasovagal or secondary arrhythmia.  If she has further     increase in back pain, should consider chest CT, but her pain is not     constant to suggest aortic dissection.  Plan - admit and monitor her     heart and provide nasal cannula oxygen, subcu Lovenox per standard     dosing, oral aspirin therapy, and IV nitroglycerin.  A cardiac consult     will be performed by [facility] - Dr. Marlene Lard notified for     a.m. consult, or sooner to consult if necessary.  2. History of hiatal hernia/gastroesophageal reflux disease - treat with     Protonix.  3. History of hypertension - continue Norvasc therapy and hold     hydrochlorothiazide.  4. History of hypercholesterolemia - continue Crestor and check liver     function tests and lipid profile in a.m.                                                Candyce Churn, M.D.    RNG/MEDQ  D:  09/07/2002  T:  09/07/2002  Job:  [id number]   cc:   Georgann Housekeeper, M.D.  [address]  [place]  [place] [address]  Fax: [phone]   Marlene Lard, M.D.

## 2010-06-10 NOTE — Consult Note (Signed)
   NAME:  SHAYDEN, GINGRICH                          ACCOUNT NO.:  [id number]   MEDICAL RECORD NO.:  [id number]                   PATIENT TYPE:  INP   LOCATION:  2037                                 FACILITY:  [facility]   PHYSICIAN:  Armanda Magic, M.D.                  DATE OF BIRTH:  Feb 17, 1916   DATE OF CONSULTATION:  DATE OF DISCHARGE:                                   CONSULTATION   CHIEF COMPLAINT:  Back and chest pain, syncope, and positive cardiac  enzymes.   HISTORY OF PRESENT ILLNESS:  This is an 77 year old white female with a  history of hypertension, hyperlipidemia, hiatal hernia with reflux, partial  colectomy secondary to diverticulosis who was in her usual state of health  until several weeks ago when she started developing episodic back pain and  three days ago the pain became much more frequent and much more severe. She  says it starts in her right shoulder blade. It radiates up to both  shoulders. Up to the upper back and neck and around up into the upper part  of her shoulders in the front. Also apparently into her lower chest and  upper abdomen, although she did not tell me this. Also associated with  nausea. She did have one episode the day of admission where she had profound  diaphoresis and then had a questionable syncopal episode. Some witness said  that she had transient loss of consciousness, was very diaphoretic, and  nauseated. She went to the emergency room and was admitted. On admission the  blood pressure was stable at 137 mmHg. Heart rate was 73, respirations 22  and she is afebrile.   Dictation ended at this point.                                               Armanda Magic, M.D.    TT/MEDQ  D:  09/08/2002  T:  09/08/2002  Job:  [id number]

## 2010-06-10 NOTE — Op Note (Signed)
NAME:  LEIDI, ASTLE                          ACCOUNT NO.:  [id number]   MEDICAL RECORD NO.:  [id number]                   PATIENT TYPE:  INP   LOCATION:  4702                                 FACILITY:  [facility]   PHYSICIAN:  Doylene Canning. Ladona Ridgel, M.D.               DATE OF BIRTH:  Dec 26, 1916   DATE OF PROCEDURE:  10/24/2002  DATE OF DISCHARGE:  10/25/2002                                 OPERATIVE REPORT   PROCEDURE PERFORMED:  Insertion of a dual chamber pacemaker.   INDICATIONS FOR PROCEDURE:  Symptomatic bradycardia with syncope.   INTRODUCTION:  The patient is an 77 year old woman with a history of  multiple  syncopal episodes over the last several weeks. Evaluation  has  been unrevealing to date. The patient underwent  head up tilt table testing  and she subsequently developed syncope in the setting of extreme bradycardia  with long 5 to 6 second pauses and prolonged heart rates less than 20 beats  a minute. This  demonstrated AV block during the episode. Her symptoms were  reproduced and she is now referred for permanent pacemaker insertion in  conjunction with additional medical therapy.   DESCRIPTION OF PROCEDURE:  After informed consent was obtained the patient  was taken to the diagnostic electrophysiology laboratory  in a fasting  state. After the usual preparation and draping, intravenous Fentanyl and  midazolam was given for sedation. A total of 30 mL of bupivacaine was  inserted subcutaneously into the left infraclavicular region.   A 6-cm incision was carried out over this region with electrocautery  utilized to dissect down to the fascial plane. Then 10 mL of contrast  demonstrated a patent left subclavian vein and it was subsequently punctured  x2 with the Guidant model [id number] bipolar pacing lead advanced  into the  right ventricle and the Guidant model [id number] atrial pacing lead  advanced  into the right atrium. Mapping was carried out in the right  ventricle, and at the final site the R-waves measured 18 millivolts and the  pacing threshold was 0.6 volts at 0.5 milliseconds. The pacing impedence  with the lead  actively fixed was 888 ohms and 10 volt pacing did not  stimulate the diaphragm.   With the ventricular leads in satisfactory position, attention was turned  the atrial lead. It was placed in the anterolateral wall of the right atrium  with P-waves measured 1.7 millivolts. The atrial threshold was a volt of 0.5  milliseconds with a pacing impedence of 426 ohms. Again 10 volt pacing did  not stimulate the diaphragm.   With both atrial and ventricular leads in satisfactory position, they were  secured to subpectoralis fascia with a figure-of-8 silk suture. The sewing  sleeve was also secured with a silk suture. Electrocautery was then utilized  to make a subcutaneous pocket. Kanamycin irrigation was utilized to irrigate  the pocket and electrocautery was utilized to  assure hemostasis.   The Guidant Insignia 1 Antra DR model 1296 dual chamber pacemaker serial  number [id number] was connected to the atrial and ventricular pacing leads and  placed in the subcutaneous pocket. The generator was secured with a silk  suture. Additional kanamycin was used to irrigate the pocket and the  incision was closed with a layer of 2-0 Vicryl followed  by a layer  of 3-0  Vicryl followed  by a layer of 4-0 Vicryl. Benzoin was painted on the skin,  Steri-Strips were applied. A pressure dressing was placed. The patient  returned to her room in satisfactory condition.   COMPLICATIONS:  There were no immediate procedure complications.   RESULTS:  This demonstrates successful  implantation of a Guidant dual  chamber pacemaker in a patient with symptomatic bradycardia and syncope.                                               Doylene Canning. Ladona Ridgel, M.D.    GWT/MEDQ  D:  10/24/2002  T:  10/25/2002  Job:  [id number]   cc:   Armanda Magic, M.D.  [address]  [place], [place] [address]  Fax: [phone]   Quita Skye. Waldon Reining, MD  [address]  [place], [place] [address]  Fax: [phone]   Kathrine Cords, R.N. [facility]

## 2010-06-10 NOTE — Cardiovascular Report (Signed)
Felicia West, Felicia West                          ACCOUNT NO.:  [id number]   MEDICAL RECORD NO.:  [id number]                   PATIENT TYPE:  INP   LOCATION:  2037                                 FACILITY:  [facility]   PHYSICIAN:  Armanda Magic, M.D.                  DATE OF BIRTH:  Jan 24, 1916   DATE OF PROCEDURE:  09/09/2002  DATE OF DISCHARGE:                              CARDIAC CATHETERIZATION   REFERRING PHYSICIAN:  Georgann Housekeeper, M.D.   SURGEON:  Armanda Magic, M.D.   PROCEDURE:  1. Left heart catheterization.  2. Coronary angiography.  3. Left ventriculography.   INDICATIONS:  Chest pain, non Q-wave MI.   COMPLICATIONS:  None.   IV ACCESS:  Via right femoral artery 6-French sheath.   HISTORY:  This is an 77 year old white female with a three day history of back  and chest pain associated with a syncopal event.  The patient ruled in for  non Q-wave MI and now presents for cardiac catheterization.   DESCRIPTION OF PROCEDURE:  The patient was brought to the cardiac  catheterization laboratory in the fasting nonsedated state.  Informed  consent was obtained.  The patient was connected to continuous heart rate,  pulse oximetry monitoring, intermittent blood pressure monitoring.  The  right groin was prepped and draped in the sterile fashion.  Because the  patient had allergy to Alcaine anesthetics, we did not use any type of  anesthetic.  We did give her Versed 2 mg to sedate her and then using the  modified Seldinger technique a 6-French sheath was placed in the right  femoral artery.  A 6-French JL4 catheter was then placed under fluoroscopic  guidance in the left coronary artery.  Multiple cineangiogram films were  taken in 30 degree RAO, 40 degree LAO views.  This catheter was then  exchanged out of her guidewire for a 6-French JR4 catheter which was placed  in the right coronary artery.  Multiple cineangiogram films were taken in 30  degree RAO, 40 degree LAO views.  This  catheter was then exchanged out of  her guidewire for a 6-French angled pigtail catheter which was placed under  fluoroscopic guidance in the left ventricular cavity.  Left ventriculography  was performed in a 30 degree RAO view using a total of 30 mL of contrast at  13 mL/second.  The catheter was then pulled back across the aortic valve  with no significant gradient noted.  At the end of the procedure all  catheters and sheaths were removed.  Manual compression was performed until  adequate hemostasis was obtained.  The patient was transferred back to her  room in stable condition.   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  Left anterior descending has at least a 50-60% ostial narrowing and then it  is widely patent throughout the rest of its course.  There are no visible  diagonal seam.   The left circumflex is a very large caliber vessel that gives rise to one  very large obtuse marginal branch which is widely patent throughout its  course and bifurcates distally into two daughter branches.  The ongoing left  circumflex is widely patent.   Right coronary artery is widely patent throughout its proximal and mid  portion.  It bifurcates distally in a posterior descending and  posterolateral artery.  There is a 60-70% ostial posterior descending artery  narrowing.   LEFT VENTRICULOGRAM:  Left ventriculography performed in a 30 degree RAO  view using a total of 30 mL of contrast at 13 mL/second showed normal LV  systolic function.  No regional wall motion abnormalities.  LV pressure  138/33 mmHg.  LVEDP 13 mmHg.  Aortic pressure 139/61 mmHg.   ASSESSMENT:  1. Borderline obstructive coronary disease of the left anterior descending     and posterior descending artery.  2. Status post non Q-wave myocardial infarction.  3.  Syncope questionably secondary to coronary ischemia versus vasovagal     episode from her pain.  4. Normal left ventricular function.   PLAN:  Adenosine Cardiolite to evaluate for inducible ischemia.  We will  discontinue her nitroglycerin drip and her Lovenox.                                               Armanda Magic, M.D.    TT/MEDQ  D:  09/09/2002  T:  09/09/2002  Job:  [id number]

## 2010-06-10 NOTE — Consult Note (Signed)
Felicia West, EVANS                          ACCOUNT NO.:  [id number]   MEDICAL RECORD NO.:  [id number]                   PATIENT TYPE:  INP   LOCATION:  2037                                 FACILITY:  [facility]   PHYSICIAN:  Armanda Magic, M.D.                  DATE OF BIRTH:  [birth year]   DATE OF CONSULTATION:  09/08/2002  DATE OF DISCHARGE:                                   CONSULTATION   CARDIOLOGY CONSULT:   REFERRING PHYSICIAN:  Georgann Housekeeper, M.D.   CHIEF COMPLAINT:  Positive cardiac enzymes, syncope and back and chest pain.   HISTORY:  This is a very pleasant 77 year old white female, with a history  of hypertension, hyperlipidemia and reflux, who for several days now, has  been having episodic back, shoulder and chest pain.  Apparently, three days  ago the pain became worse, more frequent and more severe.  She describes it  as a very sharp hard pain.  It starts in the right shoulder blade and then  radiates up into her shoulders, over into her chest and upper abdomen and  into her neck.  Is associated with nausea.  It is usually exertional and  goes away when she rests.  Yesterday on admission, she had an episode of  back pain with profound nausea and diaphoresis, and then apparently lost  consciousness.  She was transferred to [facility] ER, where her blood  pressure was stable at that time and was admitted.  Subsequently to that she  was ruled in for myocardial infarction.  She currently is pain free,  although she had one episode of chest pain this morning when she got up to  go to the bathroom.   PAST MEDICAL HISTORY:  1. Significant for hypertension.  2. Hyperlipidemia.  3. Hiatal hernia.  4. Gastroesophageal reflux.  5. Diverticulosis.  6. Constipation.   PAST SURGICAL HISTORY:  1. Status post partial colectomy secondary to diverticulosis.  2. Status post cholecystectomy in 1980.   ALLERGIES:  Her allergies include PENICILLIN.  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   MEDICATIONS:  1. Include Norvasc 5 mg a day.  2. HCTZ 12.5 mg a day.  3. Crestor; she is unsure of the dose.  4. Darvocet.  5. MiraLax.   FAMILY HISTORY:  Noncontributory.   SOCIAL HISTORY:  She denies tobacco or alcohol use.  She lives alone.  She  is very functional.  She is widowed.  She has one son, Freida Busman, who is with  her here today.  She has another son as well.   REVIEW OF SYSTEMS:  Other than what is stated in HPI is negative.   PHYSICAL EXAMINATION:  VITAL SIGNS:  Her blood pressure is 130/60, pulse 70,  afebrile.  GENERAL:  This is a well-developed, well-nourished female in  no acute  distress.  HEENT:  Benign.  NECK:  Supple without lymphadenopathy, carotid upstrokes +2 bilaterally, no  bruits.  LUNGS:  Clear to auscultation throughout.  HEART:  Regular rate and rhythm.  No murmurs, rubs or gallops.  Normal S1,  S2.  ABDOMEN:  Soft, nontender, nondistended with active bowel sounds.  No  hepatosplenomegaly.  EXTREMITIES:  No cyanosis, erythema or edema.  Good distal pulses.   LABORATORY DATA:  EKG shows normal sinus rhythm with no ST-T wave  abnormalities.  Her labs include an elevated CPK.  The initial cardiac  enzymes showed CPK-MB of 2.1, troponin less than 0.05.  Subsequent cardiac  enzymes have been elevated.  CPK 159, MB 12.2, relative index 7.7 and  troponin 1.93.  Her most recent set of enzymes: CPK is 171, MB 12.6,  relative index 7.4 and troponin 1.35.  Sodium 143, potassium 3.4, chloride  110, CO2 28, glucose 91, BUN 15, creatinine 1.0, calcium 8.7.  LFTs:  Total  protein 5.6, albumin 3.  Other LFTs are normal.  Lipase is low at 18, and  amylase is normal at 43.  D-dimers were high at 0.61.  Abdominal films were  normal.  Chest x-ray showed bilateral apical pleura-thickening, mild  cardiomegaly.  No active disease.   ASSESSMENT:  1. Back pain with radiation to her chest,  diaphoresis and nausea consistent     with unstable angina.  Patient did have positive cardiac enzymes, and     thus was ruled in for a non-Q-wave myocardial infarction.  Cardiac risk     factors include her age, hypertension, hyperlipidemia.  2. Syncope of questionable etiology.  Need to rule out aortic dissection     with the history of back pain  3. Hypertension, which is stable.  4. Hyperlipidemia.   PLAN:  1. Chest CT with contrast to rule out aortic dissection.  2. Cardiac catheterization either Tuesday or Wednesday, depending on the     cath laboratory's schedule.  3. Aspirin, nitroglycerin drip and Lovenox to be continued.  4. Add low-dose beta blockers, Lopressor 12.5 mg b.i.d.  5. Check a fasting lipid panel.                                               Armanda Magic, M.D.    TT/MEDQ  D:  09/08/2002  T:  09/08/2002  Job:  [id number]   cc:   Georgann Housekeeper, M.D.  [address]  [place]  [place] [address]  Fax: [phone]

## 2010-11-29 ENCOUNTER — Emergency Department (HOSPITAL_COMMUNITY): Payer: MEDICARE

## 2010-11-29 ENCOUNTER — Inpatient Hospital Stay (HOSPITAL_COMMUNITY)
Admission: EM | Admit: 2010-11-29 | Discharge: 2010-12-05 | DRG: 071 | Disposition: A | Discharge status: Skilled Nursing Facility | Payer: MEDICARE | Attending: Internal Medicine | Admitting: Internal Medicine

## 2010-11-29 ENCOUNTER — Encounter: Payer: Self-pay | Admitting: Emergency Medicine

## 2010-11-29 ENCOUNTER — Other Ambulatory Visit: Payer: Self-pay

## 2010-11-29 DIAGNOSIS — E785 Hyperlipidemia, unspecified: Secondary | ICD-10-CM | POA: Diagnosis present

## 2010-11-29 DIAGNOSIS — I951 Orthostatic hypotension: Secondary | ICD-10-CM | POA: Diagnosis not present

## 2010-11-29 DIAGNOSIS — Z79899 Other long term (current) drug therapy: Secondary | ICD-10-CM

## 2010-11-29 DIAGNOSIS — S2239XA Fracture of one rib, unspecified side, initial encounter for closed fracture: Secondary | ICD-10-CM

## 2010-11-29 DIAGNOSIS — R410 Disorientation, unspecified: Secondary | ICD-10-CM

## 2010-11-29 DIAGNOSIS — E860 Dehydration: Secondary | ICD-10-CM | POA: Diagnosis present

## 2010-11-29 DIAGNOSIS — G9340 Encephalopathy, unspecified: Secondary | ICD-10-CM | POA: Diagnosis present

## 2010-11-29 DIAGNOSIS — Y998 Other external cause status: Secondary | ICD-10-CM

## 2010-11-29 DIAGNOSIS — Z950 Presence of cardiac pacemaker: Secondary | ICD-10-CM

## 2010-11-29 DIAGNOSIS — I2510 Atherosclerotic heart disease of native coronary artery without angina pectoris: Secondary | ICD-10-CM | POA: Diagnosis present

## 2010-11-29 DIAGNOSIS — S5000XA Contusion of unspecified elbow, initial encounter: Secondary | ICD-10-CM | POA: Diagnosis present

## 2010-11-29 DIAGNOSIS — I10 Essential (primary) hypertension: Secondary | ICD-10-CM | POA: Diagnosis present

## 2010-11-29 DIAGNOSIS — Z66 Do not resuscitate: Secondary | ICD-10-CM | POA: Diagnosis present

## 2010-11-29 DIAGNOSIS — R4020 Unspecified coma: Secondary | ICD-10-CM

## 2010-11-29 DIAGNOSIS — S2232XA Fracture of one rib, left side, initial encounter for closed fracture: Secondary | ICD-10-CM

## 2010-11-29 DIAGNOSIS — M6282 Rhabdomyolysis: Secondary | ICD-10-CM | POA: Diagnosis present

## 2010-11-29 DIAGNOSIS — IMO0002 Reserved for concepts with insufficient information to code with codable children: Secondary | ICD-10-CM | POA: Diagnosis present

## 2010-11-29 DIAGNOSIS — F0390 Unspecified dementia without behavioral disturbance: Secondary | ICD-10-CM | POA: Diagnosis present

## 2010-11-29 DIAGNOSIS — I495 Sick sinus syndrome: Secondary | ICD-10-CM

## 2010-11-29 DIAGNOSIS — Y92009 Unspecified place in unspecified non-institutional (private) residence as the place of occurrence of the external cause: Secondary | ICD-10-CM

## 2010-11-29 DIAGNOSIS — S7000XA Contusion of unspecified hip, initial encounter: Secondary | ICD-10-CM | POA: Diagnosis present

## 2010-11-29 DIAGNOSIS — W19XXXA Unspecified fall, initial encounter: Secondary | ICD-10-CM

## 2010-11-29 DIAGNOSIS — W108XXA Fall (on) (from) other stairs and steps, initial encounter: Secondary | ICD-10-CM | POA: Diagnosis present

## 2010-11-29 DIAGNOSIS — S7001XA Contusion of right hip, initial encounter: Secondary | ICD-10-CM

## 2010-11-29 DIAGNOSIS — G9349 Other encephalopathy: Principal | ICD-10-CM | POA: Diagnosis present

## 2010-11-29 DIAGNOSIS — Y9389 Activity, other specified: Secondary | ICD-10-CM

## 2010-11-29 DIAGNOSIS — Z7982 Long term (current) use of aspirin: Secondary | ICD-10-CM

## 2010-11-29 DIAGNOSIS — M810 Age-related osteoporosis without current pathological fracture: Secondary | ICD-10-CM | POA: Diagnosis present

## 2010-11-29 DIAGNOSIS — M1710 Unilateral primary osteoarthritis, unspecified knee: Secondary | ICD-10-CM | POA: Diagnosis present

## 2010-11-29 DIAGNOSIS — R259 Unspecified abnormal involuntary movements: Secondary | ICD-10-CM | POA: Diagnosis present

## 2010-11-29 DIAGNOSIS — F4489 Other dissociative and conversion disorders: Secondary | ICD-10-CM

## 2010-11-29 HISTORY — DX: Sick sinus syndrome: I49.5

## 2010-11-29 HISTORY — DX: Atherosclerotic heart disease of native coronary artery without angina pectoris: I25.10

## 2010-11-29 HISTORY — DX: Unspecified dementia, unspecified severity, without behavioral disturbance, psychotic disturbance, mood disturbance, and anxiety: F03.90

## 2010-11-29 HISTORY — DX: Essential (primary) hypertension: I10

## 2010-11-29 LAB — POCT I-STAT, CHEM 8
Creatinine, Ser: 1.5 mg/dL — ABNORMAL HIGH (ref 0.50–1.10)
Glucose, Bld: 113 mg/dL — ABNORMAL HIGH (ref 70–99)
Hemoglobin: 13.3 g/dL (ref 12.0–15.0)
Sodium: 141 mEq/L (ref 135–145)
TCO2: 23 mmol/L (ref 0–100)

## 2010-11-29 LAB — DIFFERENTIAL
Basophils Absolute: 0 10*3/uL (ref 0.0–0.1)
Eosinophils Relative: 0 % (ref 0–5)
Lymphocytes Relative: 8 % — ABNORMAL LOW (ref 12–46)

## 2010-11-29 LAB — URINALYSIS, ROUTINE W REFLEX MICROSCOPIC
Glucose, UA: NEGATIVE mg/dL
Ketones, ur: 15 mg/dL — AB
Leukocytes, UA: NEGATIVE
Protein, ur: NEGATIVE mg/dL

## 2010-11-29 LAB — CBC
MCV: 88.6 fL (ref 78.0–100.0)
Platelets: 175 10*3/uL (ref 150–400)
RDW: 13.6 % (ref 11.5–15.5)
WBC: 12.2 10*3/uL — ABNORMAL HIGH (ref 4.0–10.5)

## 2010-11-29 LAB — APTT: aPTT: 26 seconds (ref 24–37)

## 2010-11-29 LAB — CK: Total CK: 148 U/L (ref 7–177)

## 2010-11-29 MED ORDER — OXYCODONE-ACETAMINOPHEN 5-325 MG PO TABS: 1.0000 | ORAL_TABLET | Freq: Once | ORAL | Status: DC

## 2010-11-29 NOTE — ED Notes (Signed)
Attempted to ambulate pt, pt couldn't even come off the bed dude to LUQ discomfort

## 2010-11-29 NOTE — ED Notes (Signed)
ZOX:[id number]<AV> Expected date:11/29/10<BR> Expected time: 2:52 PM<BR> Means of arrival:Ambulance<BR> Comments:<BR> M50 - 89 yoF Fall and with Hip pn  ETA 20

## 2010-11-29 NOTE — ED Provider Notes (Signed)
History     CSN: [id number] Arrival date & time: 11/29/2010  3:23 PM   First MD Initiated Contact with Patient 11/29/10 1531      Chief Complaint  Patient presents with  . Fall    (Consider location/radiation/quality/duration/timing/severity/associated sxs/prior treatment) HPI  Is brought to emergency department status and post mechanical fall with her son at bedside complaining of right hip pain, left rib pain and being chilled. Patient states she went out to check her mail this morning and stepped off of her porch step causing her to fall leaning on her right hip and hitting her left ribs as well hitting the back of her head. She denies loss of consciousness, nausea or vomiting. However patient notes that she laid in her yard for approximately 2 hours before the mailman found her yelling and assisted her inside her home. At that time she called her son who then brought her to emergency department for evaluation. Patient states when she fell she felt her hip "pop out and then pop back in." She states when she is lying still she is little to no pain but states pain in ribs and hip are aggravated by movement. Has not taken anything for pain prior to arrival. Chest pain, shortness of breath, abdominal pain, extremity pain or injury.  Past Medical History  Diagnosis Date  . Hypertension     Past Surgical History  Procedure Date  . Knee surgery   . Pacemaker insertion     History reviewed. No pertinent family history.  History  Substance Use Topics  . Smoking status: Not on file  . Smokeless tobacco: Not on file  . Alcohol Use: No    OB History    Grav Para Term Preterm Abortions TAB SAB Ect Mult Living                  Review of Systems  All other systems reviewed and are negative.    Allergies  Lidocaine  Home Medications   Current Outpatient Rx  Name Route Sig Dispense Refill  . AMLODIPINE BESYLATE 5 MG PO TABS Oral Take 5 mg by mouth daily.      . ASPIRIN EC  325 MG PO TBEC Oral Take 325 mg by mouth daily.      Marland Kitchen VITAMIN D 1000 UNITS PO TABS Oral Take 1,000 Units by mouth daily.      . IBUPROFEN 200 MG PO TABS Oral Take 200 mg by mouth every 8 (eight) hours as needed. For pain.     . IRBESARTAN 300 MG PO TABS Oral Take 300 mg by mouth daily.      Marland Kitchen METOPROLOL SUCCINATE 25 MG PO TB24 Oral Take 25 mg by mouth daily.      Marland Kitchen MIRTAZAPINE 15 MG PO TABS Oral Take 15 mg by mouth at bedtime.      Carma Leaven M PLUS PO TABS Oral Take 1 tablet by mouth daily.      . TRAMADOL HCL 50 MG PO TABS Oral Take 50 mg by mouth 2 (two) times daily as needed. For pain.     Marland Kitchen VITAMIN E 400 UNITS PO CAPS Oral Take 400 Units by mouth daily.        BP 142/60  Pulse 79  Temp(Src) 97.6 F (36.4 C) (Oral)  Resp 16  Ht 5\' 2"  (1.575 m)  Wt 160 lb (72.576 kg)  BMI 29.26 kg/m2  SpO2 93%  Physical Exam  Nursing note and vitals reviewed. Constitutional:  She is oriented to person, place, and time. She appears well-developed and well-nourished. No distress.  HENT:  Head: Normocephalic and atraumatic.  Eyes: Conjunctivae and EOM are normal. Pupils are equal, round, and reactive to light.  Neck: Normal range of motion. Neck supple.  Cardiovascular: Normal rate, regular rhythm, normal heart sounds and intact distal pulses.  Exam reveals no gallop and no friction rub.   No murmur heard. Pulmonary/Chest: Effort normal and breath sounds normal. No respiratory distress. She has no wheezes. She has no rales.       Mild tenderness to palpation of left lateral lower ribs but no step-off or crepitus.  Abdominal: Bowel sounds are normal. She exhibits no distension and no mass. There is no tenderness. There is no rebound and no guarding.  Musculoskeletal: Normal range of motion. She exhibits no edema.       Tenderness to palpation of right lateral hip and mild pain with full range of motion but no deformity or crepitus. Bilateral extremities are neurovascularly intact.  Neurological: She is  alert and oriented to person, place, and time.  Skin: Skin is warm and dry. No rash noted. She is not diaphoretic. No erythema.  Psychiatric: She has a normal mood and affect.    ED Course  Procedures (including critical care time)  8:20 PM Patient's son states he is concerned about patient's subjective confusion that has been increasing today and throughout the ER stay stating that she keeps "asking the same questions over and over again." Patient continuing to complain of pain in right hip and refuses to stand to walk due to pain and weakness.  Labs Reviewed  CBC - Abnormal; Notable for the following:    WBC 12.2 (*)    Hemoglobin 11.9 (*)    All other components within normal limits  DIFFERENTIAL - Abnormal; Notable for the following:    Neutrophils Relative 83 (*)    Neutro Abs 10.1 (*)    Lymphocytes Relative 8 (*)    Monocytes Absolute 1.1 (*)    All other components within normal limits  POCT I-STAT, CHEM 8 - Abnormal; Notable for the following:    Creatinine, Ser 1.50 (*)    Glucose, Bld 113 (*)    All other components within normal limits  CK  APTT  PROTIME-INR  I-STAT, CHEM 8  URINALYSIS, ROUTINE W REFLEX MICROSCOPIC   Dg Ribs Unilateral W/chest Left  11/29/2010  *RADIOLOGY REPORT*  Clinical Data: Left lower anterior rib pain post fall  LEFT RIBS AND CHEST - 3+ VIEW  Comparison: Chest radiograph 02/11/2010  Findings: Left subclavian sequential transvenous pacemaker leads project at right atrium and right ventricle. Enlargement of cardiac silhouette. Prominent right superior mediastinal soft tissues stable question related to tortuous innominate artery. Calcified tortuous aorta. Lungs slightly hyperinflated but clear. Eventration left diaphragm stable. No gross pleural effusion or pneumothorax. Diffuse osseous demineralization. Nondisplaced fracture anterolateral left question sixth rib.  IMPRESSION: Enlargement of cardiac silhouette post pacemaker. Osseous demineralization.  Nondisplaced fracture of left sixth rib.  Original Report Authenticated By: Lollie Marrow, M.D.   Dg Hip Complete Right  11/29/2010  *RADIOLOGY REPORT*  Clinical Data: Right hip pain post fall  RIGHT HIP - COMPLETE 2+ VIEW  Comparison: None  Findings: Osseous demineralization. Scattered vascular calcifications. Mild narrowing of the hip joints bilaterally. SI joints preserved. Degenerative changes lower lumbar spine. No definite acute fracture, dislocation or bone destruction. Few pelvic phleboliths. Bedding artifacts.  IMPRESSION: Significant osseous demineralization. Mild degenerative changes of  hip joints bilaterally. No definite acute bony abnormalities.  Original Report Authenticated By: Lollie Marrow, M.D.   Ct Head Wo Contrast  11/29/2010  *RADIOLOGY REPORT*  Clinical Data:  Fall  CT HEAD WITHOUT CONTRAST CT CERVICAL SPINE WITHOUT CONTRAST  Technique:  Multidetector CT imaging of the head and cervical spine was performed following the standard protocol without intravenous contrast.  Multiplanar CT image reconstructions of the cervical spine were also generated.  Comparison:  CT head dated 02/11/2010.  [facility] CT c- spine dated 08/03/2005.  CT HEAD  Findings: No evidence of parenchymal hemorrhage or extra-axial fluid collection. No mass lesion, mass effect, or midline shift.  No CT evidence of acute infarction.  Subcortical white matter and periventricular small vessel ischemic changes.  Age related atrophy.  No ventriculomegaly.  Intracranial atherosclerosis.  The visualized paranasal sinuses are essentially clear. The mastoid air cells are unopacified.  Prior right cataract surgery.  No evidence of calvarial fracture.  IMPRESSION: No evidence of acute intracranial abnormality.  Atrophy with small vessel ischemic changes and intracranial atherosclerosis.  CT CERVICAL SPINE  Findings: Exaggerated cervical lordosis.  No evidence of fracture or dislocation.  Vertebral body heights are  maintained.  The dens appears intact.  No prevertebral soft tissue swelling.  Moderate multilevel degenerative changes.  Visualized thyroid is unremarkable.  Visualized lung apices are clear.  IMPRESSION: No evidence of traumatic injury to the cervical spine.  Moderate multilevel degenerative changes.  Original Report Authenticated By: Charline Bills, M.D.   Ct Cervical Spine Wo Contrast  11/29/2010  *RADIOLOGY REPORT*  Clinical Data:  Fall  CT HEAD WITHOUT CONTRAST CT CERVICAL SPINE WITHOUT CONTRAST  Technique:  Multidetector CT imaging of the head and cervical spine was performed following the standard protocol without intravenous contrast.  Multiplanar CT image reconstructions of the cervical spine were also generated.  Comparison:  CT head dated 02/11/2010.   Imaging CT c- spine dated 08/03/2005.  CT HEAD  Findings: No evidence of parenchymal hemorrhage or extra-axial fluid collection. No mass lesion, mass effect, or midline shift.  No CT evidence of acute infarction.  Subcortical white matter and periventricular small vessel ischemic changes.  Age related atrophy.  No ventriculomegaly.  Intracranial atherosclerosis.  The visualized paranasal sinuses are essentially clear. The mastoid air cells are unopacified.  Prior right cataract surgery.  No evidence of calvarial fracture.  IMPRESSION: No evidence of acute intracranial abnormality.  Atrophy with small vessel ischemic changes and intracranial atherosclerosis.  CT CERVICAL SPINE  Findings: Exaggerated cervical lordosis.  No evidence of fracture or dislocation.  Vertebral body heights are maintained.  The dens appears intact.  No prevertebral soft tissue swelling.  Moderate multilevel degenerative changes.  Visualized thyroid is unremarkable.  Visualized lung apices are clear.  IMPRESSION: No evidence of traumatic injury to the cervical spine.  Moderate multilevel degenerative changes.  Original Report Authenticated By: Charline Bills, M.D.         MDM  Dr. Lynelle Doctor to follow patient to have her admitted.        Jenness Corner, PA 11/29/10 2040  Jenness Corner, [place] 11/29/10 2041

## 2010-11-29 NOTE — ED Notes (Signed)
Per EMS pt fell at home while outside, mail man helped pt inside. Pt sitting inside per ems arrival. States felt like R hip "popped" out and "popped" back in . Pain with manipulation. ROM normal no shortening or rotation noted. States pain to L side rib area. No resp distress. No LOC

## 2010-11-29 NOTE — H&P (Signed)
PCP:   Georgann Housekeeper, MD   Chief Complaint:  Fall, pain  HPI: This is a rather pleasant 77 y/o female who lives alone, she states she was going to the mailbox this AM, there is an area with a single step and no handles for support, she believes she tripped and fell. She states she hit her hip and head. No LOC. She felt her hip po out of place, currently she remains unclear if this was her right or left hip. She was able to drag herself to the foot of the stairs, where she states she felt her hip po back into place. She was unable to get inside. She laid there in the cold for approximately 3 hours until the mailman came by. He assisted her into the house. Her son, who is POA, is at her bedside. Apparently the patient has her cell phone with her during the incident and did not think to call 911. She called her son, he was at work and was unavailable to answer. He states his mother is generally ok but has been developing some mild worsening confusion over the last year or so. He believes she remains more confused than at baseline currently. In the ER the patient had severe chills and tremors, now much better. She is under a heating blanket. History obtained from patient and son. She reports no chest pain, no palpitations.  Review of Systems: (positives bolded) The patient denies anorexia, fever, weight loss,, vision loss, decreased hearing, hoarseness, chest pain, syncope, dyspnea on exertion, peripheral edema, balance deficits, hemoptysis, abdominal pain, melena, hematochezia, severe indigestion/heartburn, hematuria, incontinence, genital sores, muscle weakness, suspicious skin lesions, transient blindness, difficulty walking, depression, unusual weight change, abnormal bleeding, enlarged lymph nodes, angioedema, and breast masses.  Past Medical History: Past Medical History  Diagnosis Date  . Hypertension   . Hyperlipidemia   . SSS (sick sinus syndrome)   . CAD (coronary artery disease)   .  Dementia    Past Surgical History  Procedure Date  . Knee surgery   . Pacemaker insertion     Medications: Prior to Admission medications   Medication Sig Start Date End Date Taking? Authorizing Provider  amLODipine (NORVASC) 5 MG tablet Take 5 mg by mouth daily.     Yes Historical Provider, MD  aspirin EC 325 MG tablet Take 325 mg by mouth daily.     Yes Historical Provider, MD  cholecalciferol (VITAMIN D) 1000 UNITS tablet Take 1,000 Units by mouth daily.     Yes Historical Provider, MD  ibuprofen (ADVIL,MOTRIN) 200 MG tablet Take 200 mg by mouth every 8 (eight) hours as needed. For pain.    Yes Historical Provider, MD  irbesartan (AVAPRO) 300 MG tablet Take 300 mg by mouth daily.     Yes Historical Provider, MD  metoprolol succinate (TOPROL-XL) 25 MG 24 hr tablet Take 25 mg by mouth daily.     Yes Historical Provider, MD  mirtazapine (REMERON) 15 MG tablet Take 15 mg by mouth at bedtime.     Yes Historical Provider, MD  Multiple Vitamins-Minerals (MULTIVITAMINS THER. W/MINERALS) TABS Take 1 tablet by mouth daily.     Yes Historical Provider, MD  traMADol (ULTRAM) 50 MG tablet Take 50 mg by mouth 2 (two) times daily as needed. For pain.    Yes Historical Provider, MD  vitamin E 400 UNIT capsule Take 400 Units by mouth daily.     Yes Historical Provider, MD    Allergies:   Allergies  Allergen  Reactions  . Lidocaine     Social History:  reports that she has never smoked. She does not have any smokeless tobacco history on file. She reports that she does not drink alcohol or use illicit drugs.lives alone, widowed  Family History: Family History  Problem Relation Age of Onset  . Colon cancer Father     Physical Exam: Filed Vitals:   11/29/10 1540 11/29/10 1629 11/29/10 1957  BP: 142/60 146/60 153/63  Pulse: 79 79   Temp: 97.6 F (36.4 C) 97.6 F (36.4 C) 99.5 F (37.5 C)  TempSrc: Oral Oral Oral  Resp: 16 18 12   Height: 5\' 2"  (1.575 m)    Weight: 72.576 kg (160 lb)      SpO2: 93% 95% 94%    General:  Alert and oriented times three , well developed and nourished Eyes: PERRLA, pink conjunctiva, scleral icterus ENT: Moist oral mucosa, neck supple, no thyromegaly Lungs: clear to ascultation, no wheeze, no crackles, no use of accessory muscles Cardiovascular: regular rate and rhythm, no regurgitation, no gallops, no murmurs. No carotid bruits, no JVD Abdomen: soft, positive BS, non-tender, none distended, no organomegaly, not an acute abdomen GU: not examined Neuro: CN II - XII grossly intact, sensation intact. Occasional tremors Musculoskeletal: strength 5/5 all extremities, no clubbing, cyanosis or edema Skin: no rash, no subcutaneous crepitation, no decubitus. Bruising left elbow Psych: appropriate patient. A & O times 3, but mildly confused   Labs on Admission:   [facility] 11/29/10 1658  NA 141  K 3.9  CL 108  CO2 --  GLUCOSE 113*  BUN 21  CREATININE 1.50*  CALCIUM --  MG --  PHOS --   No results found for this basename: AST:2,ALT:2,ALKPHOS:2,BILITOT:2,PROT:2,ALBUMIN:2 in the last 72 hours No results found for this basename: LIPASE:2,AMYLASE:2 in the last 72 hours  Basename 11/29/10 1658 11/29/10 1651  WBC -- 12.2*  NEUTROABS -- 10.1*  HGB 13.3 11.9*  HCT 39.0 37.2  MCV -- 88.6  PLT -- 175    Basename 11/29/10 1651  CKTOTAL 148  CKMB --  CKMBINDEX --  TROPONINI --   No results found for this basename: TSH,T4TOTAL,FREET3,T3FREE,THYROIDAB in the last 72 hours No results found for this basename: VITAMINB12:2,FOLATE:2,FERRITIN:2,TIBC:2,IRON:2,RETICCTPCT:2 in the last 72 hours  Radiological Exams on Admission: Dg Ribs Unilateral W/chest Left  11/29/2010  *RADIOLOGY REPORT*  Clinical Data: Left lower anterior rib pain post fall  LEFT RIBS AND CHEST - 3+ VIEW  Comparison: Chest radiograph 02/11/2010  Findings: Left subclavian sequential transvenous pacemaker leads project at right atrium and right ventricle. Enlargement of cardiac  silhouette. Prominent right superior mediastinal soft tissues stable question related to tortuous innominate artery. Calcified tortuous aorta. Lungs slightly hyperinflated but clear. Eventration left diaphragm stable. No gross pleural effusion or pneumothorax. Diffuse osseous demineralization. Nondisplaced fracture anterolateral left question sixth rib.  IMPRESSION: Enlargement of cardiac silhouette post pacemaker. Osseous demineralization. Nondisplaced fracture of left sixth rib.  Original Report Authenticated By: Lollie Marrow, M.D.   Dg Hip Complete Right  11/29/2010  *RADIOLOGY REPORT*  Clinical Data: Right hip pain post fall  RIGHT HIP - COMPLETE 2+ VIEW  Comparison: None  Findings: Osseous demineralization. Scattered vascular calcifications. Mild narrowing of the hip joints bilaterally. SI joints preserved. Degenerative changes lower lumbar spine. No definite acute fracture, dislocation or bone destruction. Few pelvic phleboliths. Bedding artifacts.  IMPRESSION: Significant osseous demineralization. Mild degenerative changes of hip joints bilaterally. No definite acute bony abnormalities.  Original Report Authenticated By: Lollie Marrow,  M.D.   Ct Head Wo Contrast  11/29/2010  *RADIOLOGY REPORT*  Clinical Data:  Fall  CT HEAD WITHOUT CONTRAST CT CERVICAL SPINE WITHOUT CONTRAST  Technique:  Multidetector CT imaging of the head and cervical spine was performed following the standard protocol without intravenous contrast.  Multiplanar CT image reconstructions of the cervical spine were also generated.  Comparison:  CT head dated 02/11/2010.  [facility] CT c- spine dated 08/03/2005.  CT HEAD  Findings: No evidence of parenchymal hemorrhage or extra-axial fluid collection. No mass lesion, mass effect, or midline shift.  No CT evidence of acute infarction.  Subcortical white matter and periventricular small vessel ischemic changes.  Age related atrophy.  No ventriculomegaly.  Intracranial  atherosclerosis.  The visualized paranasal sinuses are essentially clear. The mastoid air cells are unopacified.  Prior right cataract surgery.  No evidence of calvarial fracture.  IMPRESSION: No evidence of acute intracranial abnormality.  Atrophy with small vessel ischemic changes and intracranial atherosclerosis.  CT CERVICAL SPINE  Findings: Exaggerated cervical lordosis.  No evidence of fracture or dislocation.  Vertebral body heights are maintained.  The dens appears intact.  No prevertebral soft tissue swelling.  Moderate multilevel degenerative changes.  Visualized thyroid is unremarkable.  Visualized lung apices are clear.  IMPRESSION: No evidence of traumatic injury to the cervical spine.  Moderate multilevel degenerative changes.  Original Report Authenticated By: Charline Bills, M.D.   Ct Cervical Spine Wo Contrast  11/29/2010  *RADIOLOGY REPORT*  Clinical Data:  Fall  CT HEAD WITHOUT CONTRAST CT CERVICAL SPINE WITHOUT CONTRAST  Technique:  Multidetector CT imaging of the head and cervical spine was performed following the standard protocol without intravenous contrast.  Multiplanar CT image reconstructions of the cervical spine were also generated.  Comparison:  CT head dated 02/11/2010.  [facility] CT c- spine dated 08/03/2005.  CT HEAD  Findings: No evidence of parenchymal hemorrhage or extra-axial fluid collection. No mass lesion, mass effect, or midline shift.  No CT evidence of acute infarction.  Subcortical white matter and periventricular small vessel ischemic changes.  Age related atrophy.  No ventriculomegaly.  Intracranial atherosclerosis.  The visualized paranasal sinuses are essentially clear. The mastoid air cells are unopacified.  Prior right cataract surgery.  No evidence of calvarial fracture.  IMPRESSION: No evidence of acute intracranial abnormality.  Atrophy with small vessel ischemic changes and intracranial atherosclerosis.  CT CERVICAL SPINE  Findings: Exaggerated  cervical lordosis.  No evidence of fracture or dislocation.  Vertebral body heights are maintained.  The dens appears intact.  No prevertebral soft tissue swelling.  Moderate multilevel degenerative changes.  Visualized thyroid is unremarkable.  Visualized lung apices are clear.  IMPRESSION: No evidence of traumatic injury to the cervical spine.  Moderate multilevel degenerative changes.  Original Report Authenticated By: Charline Bills, M.D.   Ct Hip Right Wo Contrast  11/29/2010  *RADIOLOGY REPORT*  Clinical Data: Fall  CT OF THE RIGHT HIP WITHOUT CONTRAST  Technique:  Multidetector CT imaging was performed according to the standard protocol. Multiplanar CT image reconstructions were also generated.  Comparison: None.  Findings: No acute fracture.  No dislocation.  Mild degenerative changes.  Unremarkable soft tissues other than vascular calcifications. Right greater trochanter spurring.  IMPRESSION: No acute bony injury of the right femoral neck or acetabulum.  Original Report Authenticated By: Donavan Burnet, M.D.    Assessment/Plan Present on Admission:  .Encephalopathy acute -likely due to recent events, cold exposure.  -she does have a h/o mild dementia  Rib fracture Pain control -patient currently unwilling or unable to stand -consult PT -pain control HTN CAD -stable -resume home meds  Code status: DNR DVT prophylaxis Team 2  Jannessa Ogden 11/29/2010, 10:47 PM

## 2010-11-29 NOTE — ED Provider Notes (Signed)
See prior note  Medical screening examination/treatment/procedure(s) were conducted as a shared visit with non-physician practitioner(s) and myself.  I personally evaluated the patient during the encounter Devoria Albe, MD, Franz Dell, MD 11/29/10 2130

## 2010-11-29 NOTE — ED Notes (Signed)
Pt gone to xray. Will get labs when pt returns to room. RN aware

## 2010-11-29 NOTE — ED Provider Notes (Signed)
Dr Haroldine Laws  Wants to be called back when the hip CT result is back.   Devoria Albe, MD, Armando Gang   Ward Givens, MD 11/29/10 2103

## 2010-11-29 NOTE — ED Notes (Signed)
Pt unable to ambulate due to pain

## 2010-11-29 NOTE — ED Notes (Signed)
Family at bedside. 

## 2010-11-29 NOTE — ED Provider Notes (Signed)
Patient relates she went out to get her newspaper this morning and she fell. She was unable to get up for several hours. She had a phone with her and she kept trying to call her son who was at work and didn't respond. Finally the mailman heard her yelling for help and help came to get her up and to get help. She relates she has pain in her right hip and her left ribs.also noted to be confused per her son. He states she normally lives alone and does not have confusion. He states she's asked her several times the same question about what happened today. Patient is noted to have some shivering. She is awake alert and cooperative. She has good range of motion of both her lower extremities with some minor stiffness from her arthritis. She is also tender in her left rib cage.  Medical screening examination/treatment/procedure(s) were conducted as a shared visit with non-physician practitioner(s) and myself.  I personally evaluated the patient during the encounter Devoria Albe, MD, Franz Dell, MD 11/29/10 [phone]

## 2010-11-29 NOTE — ED Notes (Signed)
Pt has returned from ct via stretcher. Son remains at bedside. Will cont to monitor

## 2010-11-29 NOTE — ED Provider Notes (Signed)
Dr Joneen Roach has accepted for admission to med bed triad team 2  Ward Givens, MD 11/29/10 2151

## 2010-11-29 NOTE — ED Notes (Signed)
Pt still in xray. Will get labs when pt returns.

## 2010-11-30 ENCOUNTER — Other Ambulatory Visit: Payer: Self-pay

## 2010-11-30 ENCOUNTER — Encounter (HOSPITAL_COMMUNITY): Payer: Self-pay | Admitting: *Deleted

## 2010-11-30 DIAGNOSIS — R4020 Unspecified coma: Secondary | ICD-10-CM | POA: Diagnosis not present

## 2010-11-30 LAB — CBC
HCT: 33.4 % — ABNORMAL LOW (ref 36.0–46.0)
MCH: 29.3 pg (ref 26.0–34.0)
MCHC: 32.9 g/dL (ref 30.0–36.0)
MCV: 88.8 fL (ref 78.0–100.0)
Platelets: 160 10*3/uL (ref 150–400)
RDW: 13.4 % (ref 11.5–15.5)
WBC: 9.5 10*3/uL (ref 4.0–10.5)

## 2010-11-30 LAB — CARDIAC PANEL(CRET KIN+CKTOT+MB+TROPI)
CK, MB: 5.4 ng/mL — ABNORMAL HIGH (ref 0.3–4.0)
Relative Index: 2 (ref 0.0–2.5)
Troponin I: <0.3 ng/mL (ref <0.30)

## 2010-11-30 LAB — BASIC METABOLIC PANEL
BUN: 23 mg/dL (ref 6–23)
CO2: 26 mEq/L (ref 19–32)
Calcium: 9.1 mg/dL (ref 8.4–10.5)
Creatinine, Ser: 1.32 mg/dL — ABNORMAL HIGH (ref 0.50–1.10)
Glucose, Bld: 121 mg/dL — ABNORMAL HIGH (ref 70–99)

## 2010-11-30 MED ORDER — ASPIRIN EC 325 MG PO TBEC
325.0000 mg | DELAYED_RELEASE_TABLET | Freq: Every day | ORAL | Status: DC
  Administered 2010-11-30 – 2010-12-05 (×6): 325 mg via ORAL
  Filled 2010-11-30 (×6): qty 1

## 2010-11-30 MED ORDER — OLMESARTAN MEDOXOMIL 40 MG PO TABS
40.0000 mg | ORAL_TABLET | Freq: Every day | ORAL | Status: DC
  Administered 2010-11-30 – 2010-12-01 (×2): 40 mg via ORAL
  Filled 2010-11-30 (×3): qty 1

## 2010-11-30 MED ORDER — MORPHINE SULFATE 2 MG/ML IJ SOLN: 1.0000 mg | INTRAMUSCULAR | Status: DC | PRN

## 2010-11-30 MED ORDER — HYDROCODONE-ACETAMINOPHEN 5-325 MG PO TABS
1.0000 | ORAL_TABLET | ORAL | Status: DC | PRN
  Administered 2010-11-30 (×2): 1 via ORAL
  Filled 2010-11-30 (×2): qty 1

## 2010-11-30 MED ORDER — IBUPROFEN 200 MG PO TABS
200.0000 mg | ORAL_TABLET | Freq: Three times a day (TID) | ORAL | Status: DC | PRN
  Filled 2010-11-30: qty 1

## 2010-11-30 MED ORDER — MIRTAZAPINE 15 MG PO TABS
15.0000 mg | ORAL_TABLET | Freq: Every day | ORAL | Status: DC
  Administered 2010-11-30 – 2010-12-04 (×5): 15 mg via ORAL
  Filled 2010-11-30 (×6): qty 1

## 2010-11-30 MED ORDER — METOPROLOL SUCCINATE ER 25 MG PO TB24
25.0000 mg | ORAL_TABLET | Freq: Every day | ORAL | Status: DC
  Administered 2010-11-30 – 2010-12-05 (×5): 25 mg via ORAL
  Filled 2010-11-30 (×6): qty 1

## 2010-11-30 MED ORDER — ENOXAPARIN SODIUM 40 MG/0.4ML ~~LOC~~ SOLN
40.0000 mg | Freq: Every day | SUBCUTANEOUS | Status: DC
  Administered 2010-11-30 – 2010-12-05 (×6): 40 mg via SUBCUTANEOUS
  Filled 2010-11-30 (×6): qty 0.4

## 2010-11-30 MED ORDER — AMLODIPINE BESYLATE 5 MG PO TABS
5.0000 mg | ORAL_TABLET | Freq: Every day | ORAL | Status: DC
  Administered 2010-11-30: 5 mg via ORAL
  Filled 2010-11-30 (×3): qty 1

## 2010-11-30 NOTE — Progress Notes (Signed)
Patient had passed out with PT at 9:30 this am after she was toiletted using bedside commode. Pt had been assisted to bed & regained concsiousness then with v/s: 138/73, 98.1, 74,18, 92% RA. Rapid response team summoned to room. Md notified with orders: cardiac panel-done, EKG-done & transfer pt to Telemetry (1429). Son at bedside & was notified of incident & and transfer to tele. Pt  Noted w/ small bile-like vomitus. Report to tele rn, trasfer pt as ordered.

## 2010-11-30 NOTE — Progress Notes (Signed)
Subjective: Called by nurse as patient was one commode confused probably little weak. At this time the patient is alert and oriented x3 with no complaints. She relates that she had some chest pain and shortness of breath when this happens. I now she appears tired. Is complaining that she feels weak. Objective: Filed Vitals:   11/30/10 0125 11/30/10 0305 11/30/10 0600 11/30/10 0945  BP: 173/81  118/65 138/75  Pulse: 89  75 77  Temp: 99.5 F (37.5 C) 98.7 F (37.1 C) 97.6 F (36.4 C) 97.5 F (36.4 C)  TempSrc: Axillary Oral Oral Oral  Resp: 19  18 18   Height: 5\' 2"  (1.575 m)     Weight: 73.6 kg (162 lb 4.1 oz)     SpO2: 92%  91% 97%   Weight change:   Intake/Output Summary (Last 24 hours) at 11/30/10 1029 Last data filed at 11/30/10 0500  Gross per 24 hour  Intake    120 ml  Output      0 ml  Net    120 ml    General: Alert, awake, oriented x3, in no acute distress.  HEENT: No bruits, no goiter.  Heart: Regular rate and rhythm, without murmurs, rubs, gallops.  Lungs: Good air movement, bilateral air movement.  Abdomen: Soft, nontender, nondistended, positive bowel sounds.  Neuro: Grossly intact, nonfocal.   Lab Results:  [facility] 11/30/10 0430 11/29/10 1658  NA 136 141  K 3.7 3.9  CL 102 108  CO2 26 --  GLUCOSE 121* 113*  BUN 23 21  CREATININE 1.32* 1.50*  CALCIUM 9.1 --  MG -- --  PHOS -- --      Basename 11/30/10 0430 11/29/10 1658 11/29/10 1651  WBC 9.5 -- 12.2*  NEUTROABS -- -- 10.1*  HGB 11.0* 13.3 --  HCT 33.4* 39.0 --  MCV 88.8 -- 88.6  PLT 160 -- 175    Basename 11/29/10 1651  CKTOTAL 148  CKMB --  CKMBINDEX --  TROPONINI --          Micro Results: No results found for this or any previous visit (from the past 240 hour(s)).  Studies/Results: Dg Ribs Unilateral W/chest Left  11/29/2010  *RADIOLOGY REPORT*  Clinical Data: Left lower anterior rib pain post fall  LEFT RIBS AND CHEST - 3+ VIEW  Comparison: Chest radiograph 02/11/2010   Findings: Left subclavian sequential transvenous pacemaker leads project at right atrium and right ventricle. Enlargement of cardiac silhouette. Prominent right superior mediastinal soft tissues stable question related to tortuous innominate artery. Calcified tortuous aorta. Lungs slightly hyperinflated but clear. Eventration left diaphragm stable. No gross pleural effusion or pneumothorax. Diffuse osseous demineralization. Nondisplaced fracture anterolateral left question sixth rib.  IMPRESSION: Enlargement of cardiac silhouette post pacemaker. Osseous demineralization. Nondisplaced fracture of left sixth rib.  Original Report Authenticated By: Lollie Marrow, M.D.   Dg Hip Complete Right  11/29/2010  *RADIOLOGY REPORT*  Clinical Data: Right hip pain post fall  RIGHT HIP - COMPLETE 2+ VIEW  Comparison: None  Findings: Osseous demineralization. Scattered vascular calcifications. Mild narrowing of the hip joints bilaterally. SI joints preserved. Degenerative changes lower lumbar spine. No definite acute fracture, dislocation or bone destruction. Few pelvic phleboliths. Bedding artifacts.  IMPRESSION: Significant osseous demineralization. Mild degenerative changes of hip joints bilaterally. No definite acute bony abnormalities.  Original Report Authenticated By: Lollie Marrow, M.D.   Ct Head Wo Contrast  11/29/2010  *RADIOLOGY REPORT*  Clinical Data:  Fall  CT HEAD WITHOUT CONTRAST CT CERVICAL SPINE  WITHOUT CONTRAST  Technique:  Multidetector CT imaging of the head and cervical spine was performed following the standard protocol without intravenous contrast.  Multiplanar CT image reconstructions of the cervical spine were also generated.  Comparison:  CT head dated 02/11/2010.  [facility] CT c- spine dated 08/03/2005.  CT HEAD  Findings: No evidence of parenchymal hemorrhage or extra-axial fluid collection. No mass lesion, mass effect, or midline shift.  No CT evidence of acute infarction.  Subcortical  white matter and periventricular small vessel ischemic changes.  Age related atrophy.  No ventriculomegaly.  Intracranial atherosclerosis.  The visualized paranasal sinuses are essentially clear. The mastoid air cells are unopacified.  Prior right cataract surgery.  No evidence of calvarial fracture.  IMPRESSION: No evidence of acute intracranial abnormality.  Atrophy with small vessel ischemic changes and intracranial atherosclerosis.  CT CERVICAL SPINE  Findings: Exaggerated cervical lordosis.  No evidence of fracture or dislocation.  Vertebral body heights are maintained.  The dens appears intact.  No prevertebral soft tissue swelling.  Moderate multilevel degenerative changes.  Visualized thyroid is unremarkable.  Visualized lung apices are clear.  IMPRESSION: No evidence of traumatic injury to the cervical spine.  Moderate multilevel degenerative changes.  Original Report Authenticated By: Charline Bills, M.D.   Ct Cervical Spine Wo Contrast  11/29/2010  *RADIOLOGY REPORT*  Clinical Data:  Fall  CT HEAD WITHOUT CONTRAST CT CERVICAL SPINE WITHOUT CONTRAST  Technique:  Multidetector CT imaging of the head and cervical spine was performed following the standard protocol without intravenous contrast.  Multiplanar CT image reconstructions of the cervical spine were also generated.  Comparison:  CT head dated 02/11/2010.   Imaging CT c- spine dated 08/03/2005.  CT HEAD  Findings: No evidence of parenchymal hemorrhage or extra-axial fluid collection. No mass lesion, mass effect, or midline shift.  No CT evidence of acute infarction.  Subcortical white matter and periventricular small vessel ischemic changes.  Age related atrophy.  No ventriculomegaly.  Intracranial atherosclerosis.  The visualized paranasal sinuses are essentially clear. The mastoid air cells are unopacified.  Prior right cataract surgery.  No evidence of calvarial fracture.  IMPRESSION: No evidence of acute intracranial abnormality.   Atrophy with small vessel ischemic changes and intracranial atherosclerosis.  CT CERVICAL SPINE  Findings: Exaggerated cervical lordosis.  No evidence of fracture or dislocation.  Vertebral body heights are maintained.  The dens appears intact.  No prevertebral soft tissue swelling.  Moderate multilevel degenerative changes.  Visualized thyroid is unremarkable.  Visualized lung apices are clear.  IMPRESSION: No evidence of traumatic injury to the cervical spine.  Moderate multilevel degenerative changes.  Original Report Authenticated By: Charline Bills, M.D.   Ct Hip Right Wo Contrast  11/29/2010  *RADIOLOGY REPORT*  Clinical Data: Fall  CT OF THE RIGHT HIP WITHOUT CONTRAST  Technique:  Multidetector CT imaging was performed according to the standard protocol. Multiplanar CT image reconstructions were also generated.  Comparison: None.  Findings: No acute fracture.  No dislocation.  Mild degenerative changes.  Unremarkable soft tissues other than vascular calcifications. Right greater trochanter spurring.  IMPRESSION: No acute bony injury of the right femoral neck or acetabulum.  Original Report Authenticated By: Donavan Burnet, M.D.    Medications: I have reviewed the patient's current medications.   Patient Active Hospital Problem List: 1. LOC (loss of consciousness) (11/30/2010)  with loss of consciousness here in the hospital. The patient on telemetry we'll put her on telemetry at this time. We'll cycle cardiac enzymes. At  this time the patient is asymptomatic. She relates she had some shortness of breath and some mild chest discomfort when this happened. She does have a history of  sick sinus syndrome.   2.SSS (sick sinus syndrome) (11/29/2010)  transfer to telemetry.   3. Encephalopathy acute (11/29/2010)  probably some kind of arrhythmia calcium hypoperfusion. She is to be transferred to telemetry.   4.HTN (hypertension) (11/29/2010)  stable continue current medications. The patient is on a  beta blocker. But she had no episodes of bradycardia on EKG we'll continue her beta blocker. And will transfer to telemetry the  5.Hyperlipidemia (11/29/2010)  no change continue current meds.   6.CAD (coronary artery disease) (11/29/2010)  continue aspirin.and metoprolol.    LOS: 1 day   ORTIZ, Darin Engels 11/30/2010, 10:29 AM

## 2010-11-30 NOTE — Significant Event (Signed)
Rapid Response Event Note  Overview: Time Called: 0935 Arrival Time: 0940 Event Type: Unknown  Initial Focused Assessment: see note Interventions: 12 ld EKG , IV inserted Event Summary:  OOB with PT and became less responsive and weak. Difficulty answering questions initially but within 5 minutes alert and oriented times 4.  C/O midepigastric discomfort. 5-10 cc yellow emesis. Name of Physician Notified: A. Ortiz at 1000 O2 increased to 4L .  Stat EKG obtained and results called to Dr Robb Matar.  Pt continues to have some nausea and lethargy but neuro assessment is WNL.    Outcome: Transferred (Comment);Other (Comment) (tele)  Event End Time: 1027  Robb Matar

## 2010-11-30 NOTE — Progress Notes (Signed)
Physical Therapy Evaluation Patient Details Name: Felicia West MRN: [id number] DOB: Oct 20, 1916 Today's Date: 11/30/2010 9:03-9:35, EV2  Problem List:  Patient Active Problem List  Diagnoses  . Encephalopathy acute  . HTN (hypertension)  . SSS (sick sinus syndrome)  . Hyperlipidemia  . CAD (coronary artery disease)  . Dementia    Past Medical History:  Past Medical History  Diagnosis Date  . Hypertension   . Hyperlipidemia   . SSS (sick sinus syndrome)   . CAD (coronary artery disease)   . Dementia    Past Surgical History:  Past Surgical History  Procedure Date  . Knee surgery   . Pacemaker insertion   . Cardiac catheterization   . Vascular surgery     PT Assessment/Plan/Recommendation PT Assessment Clinical Impression Statement: Pt presents with decreased overall functional mobility secondary to pain and generalized weakness.  Today during PT eval, pt transfered from supine to sit, SPT to [facility] with MIN A and was able to void.  After being cleaned, attempted to stand for transfer to recliner.  Pt felt weak, and was holding her head.  A dina-map was brought in, but pt became unresponsive and nursing called.  Pt transferred back to bed by PT tech, and  nursing arrived and called a rapid response.  Please refer to nursing notes re: vitals.  Pt was awake when PT left pt with nursing.  PT Recommendation/Assessment: Patient will need skilled PT in the acute care venue PT Problem List: Decreased strength;Decreased activity tolerance;Decreased cognition;Pain;Cardiopulmonary status limiting activity PT Therapy Diagnosis : Difficulty walking;Generalized weakness PT Plan PT Frequency: Min 3X/week PT Treatment/Interventions: DME instruction;Gait training;Functional mobility training;Therapeutic exercise;Patient/family education PT Recommendation Recommendations for Other Services: OT consult Follow Up Recommendations: Skilled nursing facility Equipment Recommended: Defer to next  venue PT Goals  Acute Rehab PT Goals PT Goal Formulation: Patient unable to participate in goal setting Time For Goal Achievement: 2 weeks Pt will go Supine/Side to Sit: with supervision PT Goal: Supine/Side to Sit - Progress: Progressing toward goal Pt will Transfer Sit to Stand/Stand to Sit: with supervision;with cues (comment type and amount) (With no cueing) PT Transfer Goal: Sit to Stand/Stand to Sit - Progress: Progressing toward goal Pt will Ambulate: 51 - 150 feet;with rolling walker;with supervision PT Goal: Ambulate - Progress: Progressing toward goal  PT Evaluation Precautions/Restrictions  Precautions Precautions: Fall Precaution Comments: L 6th rib fx Prior Functioning  Home Living Lives With: Alone Receives Help From: Family Type of Home: House Home Access: Stairs to enter [employer] of Steps: 1 Bathroom Shower/Tub: Tub/shower unit;Walk-in shower Home Adaptive Equipment: Bedside commode/3-in-1;Walker - rolling Prior Function Level of Independence: Independent with homemaking with ambulation;Independent with basic ADLs;Independent with transfers Cognition Cognition Orientation Level: Disoriented to time;Oriented to person;Oriented to place;Oriented to situation Sensation/Coordination   Extremity Assessment RLE Assessment RLE Assessment: Within Functional Limits (for transfers, but not longer distances) LLE Assessment LLE Assessment: Within Functional Limits (for transfers, not longer distances) Mobility (including Balance) Bed Mobility Bed Mobility: Yes Supine to Sit: 4: Min assist;HOB elevated (Comment degrees) Supine to Sit Details (indicate cue type and reason): HOB at 30 degrees.  A for last part of transfer. Transfers Transfers: Yes Sit to Stand: 4: Min assist Sit to Stand Details (indicate cue type and reason): cue to push up off bed Stand to Sit: 4: Min assist Stand to Sit Details: Cues to reach hands back Stand Pivot Transfers: 4: Min  assist Stand Pivot Transfer Details (indicate cue type and reason): Cues to  keep hands on RW til in front of BSC.    Exercise    End of Session PT - End of Session Equipment Utilized During Treatment: Gait belt Activity Tolerance: Treatment limited secondary to medical complications (Comment) (Rapid response called, see clinical impression for details) Patient left: in bed;Other (comment) (with multiple nursing staff present) Nurse Communication: Mobility status for transfers (What pt had been doing at time when decreased LOC) General Behavior During Session: [facility] for tasks performed (until decreased LOC) Cognition: Impaired, at baseline  Felicia West 11/30/2010, 10:13 AM

## 2010-12-01 DIAGNOSIS — W19XXXA Unspecified fall, initial encounter: Secondary | ICD-10-CM

## 2010-12-01 DIAGNOSIS — F4489 Other dissociative and conversion disorders: Secondary | ICD-10-CM

## 2010-12-01 DIAGNOSIS — S2239XA Fracture of one rib, unspecified side, initial encounter for closed fracture: Secondary | ICD-10-CM

## 2010-12-01 LAB — CARDIAC PANEL(CRET KIN+CKTOT+MB+TROPI)
CK, MB: 7.4 ng/mL (ref 0.3–4.0)
Total CK: 330 U/L — ABNORMAL HIGH (ref 7–177)

## 2010-12-01 MED ORDER — ACETAMINOPHEN 500 MG PO TABS
500.0000 mg | ORAL_TABLET | Freq: Four times a day (QID) | ORAL | Status: DC | PRN
  Filled 2010-12-01: qty 1

## 2010-12-01 MED ORDER — QUETIAPINE FUMARATE 25 MG PO TABS
25.0000 mg | ORAL_TABLET | Freq: Every day | ORAL | Status: DC
  Administered 2010-12-01 – 2010-12-04 (×4): 25 mg via ORAL
  Filled 2010-12-01 (×7): qty 1

## 2010-12-01 NOTE — Progress Notes (Signed)
In and out cath pt with 400 ml output.  Urine foul concentrated. Will continue to monitor.

## 2010-12-01 NOTE — Progress Notes (Signed)
No urine output reported for past 8 hours.  MD notified. New orders received.  Will continue to monitor.

## 2010-12-01 NOTE — Progress Notes (Signed)
CSW completed FL-2 and placed in the chart to be signed by the physician. CSW also faxed clinicals to [facility] per pts son/POA request. Janie at [facility] verified that they would make a bed offer for the pt and they could most likely switch her over to long term if needed if pt does not progress with short term rehab. CSW informed the pts son in person that [facility] had made a bed offer. CSW will continue to follow. Golden Pop 12/01/2010 3:44 PM [phone]

## 2010-12-01 NOTE — Consult Note (Signed)
Admit date: 11/29/2010 Referring Physician  [facility] Primary Physician  Mammie Lorenzo Primary Cardiologist  Armanda Magic Reason for Consultation  syncope and possible pacemaker failure  ASSESSMENT: #1. Recurrent episodes of falling and loss of consciousness. Rule out syncope. Possibly related to pacemaker power source failure.  2. Dementia  3. Permanent pacemaker 2004 for sick sinus syndrome  4. Chronic kidney disease  5. Coronary atherosclerosis with prior history of non-ST elevation myocardial infarction 2004. Catheterization at that time demonstrated moderate obstructive disease.  Plan: 1. Pacemaker interrogation  2. Orthostatic blood pressure determinations  3. Consider neurology consultation for evaluation of dementia and also medical concerns for reversible causes of altered mental status.  4. The patient's overall prognosis appears relatively poor given her age and concomitant medical comorbidities. It would be wise at this time to address CODE STATUS and expectation of care goals with the family.  HPI: The patient is 77 years of age and unable to give a history. Her sons were available after my and counter with her and stated that for the past several weeks she has had episodes of sudden weakness, falling, and episodes of unconsciousness. Her pacemaker is nearing elective replacement indicator based on most recent office notes which I have attached under the consult notes. The patient denies any cardiovascular complaints. According to the son she has had at least one episode of unconsciousness since hospitalization when she attempted to go to the bathroom.   PMH:   Past Medical History  Diagnosis Date  . Hypertension   . Hyperlipidemia   . SSS (sick sinus syndrome)   . CAD (coronary artery disease)   . Dementia      PSH:   Past Surgical History  Procedure Date  . Knee surgery   . Pacemaker insertion   . Cardiac catheterization   . Vascular surgery     Allergies:  Lidocaine Prior to Admit Meds:   Prescriptions prior to admission  Medication Sig Dispense Refill  . amLODipine (NORVASC) 5 MG tablet Take 5 mg by mouth daily.        Marland Kitchen aspirin EC 325 MG tablet Take 325 mg by mouth daily.        . cholecalciferol (VITAMIN D) 1000 UNITS tablet Take 1,000 Units by mouth daily.        Marland Kitchen ibuprofen (ADVIL,MOTRIN) 200 MG tablet Take 200 mg by mouth every 8 (eight) hours as needed. For pain.       Marland Kitchen irbesartan (AVAPRO) 300 MG tablet Take 300 mg by mouth daily.        . metoprolol succinate (TOPROL-XL) 25 MG 24 hr tablet Take 25 mg by mouth daily.        . mirtazapine (REMERON) 15 MG tablet Take 15 mg by mouth at bedtime.        . Multiple Vitamins-Minerals (MULTIVITAMINS THER. W/MINERALS) TABS Take 1 tablet by mouth daily.        . traMADol (ULTRAM) 50 MG tablet Take 50 mg by mouth 2 (two) times daily as needed. For pain.       . vitamin E 400 UNIT capsule Take 400 Units by mouth daily.         Fam HX:    Family History  Problem Relation Age of Onset  . Colon cancer Father    Social HX:    History   Social History  . Marital Status: Widowed    Spouse Name: N/A    Number of Children: N/A  . Years  of Education: N/A   Occupational History  . Not on file.   Social History Main Topics  . Smoking status: Never Smoker   . Smokeless tobacco: Never Used  . Alcohol Use: No  . Drug Use: No  . Sexually Active: Not Currently   Other Topics Concern  . Not on file   Social History Narrative  . No narrative on file     Review of Systems: I was unable to obtain any pertinent review of systems because of the patient's inability to give a history. She is extremely confused at the time of my encounter with her.  Physical Exam: Blood pressure 101/61, pulse 81, temperature 100.1 F (37.8 C), temperature source Axillary, resp. rate 18, height 5\' 2"  (1.575 m), weight 73.6 kg (162 lb 4.1 oz), SpO2 90.00%. Weight change:   General appearance:  cachectic, uncooperative and Confused Head: Normocephalic, without obvious abnormality, atraumatic, No obvious evidence of trauma resulting from recent falls. Neck: no adenopathy, no carotid bruit, no JVD, supple, symmetrical, trachea midline and thyroid not enlarged, symmetric, no tenderness/mass/nodules Resp: clear to auscultation bilaterally Cardio: regular rate and rhythm, S1, S2 normal, no murmur, click, rub or gallop and No obvious murmur or gallop is noted. Extremities: extremities normal, atraumatic, no cyanosis or edema Pulses: 2+ and symmetric Neurologic: Mental status: The patient appears confused. She is not oriented to place, or time. There no obvious motor deficits noted. Labs:   Lab Results  Component Value Date   WBC 9.5 11/30/2010   HGB 11.0* 11/30/2010   HCT 33.4* 11/30/2010   MCV 88.8 11/30/2010   PLT 160 11/30/2010    Lab 11/30/10 0430  NA 136  K 3.7  CL 102  CO2 26  BUN 23  CREATININE 1.32*  CALCIUM 9.1  PROT --  BILITOT --  ALKPHOS --  ALT --  AST --  GLUCOSE 121*   No results found for this basename: PTT   Lab Results  Component Value Date   INR 1.06 11/29/2010   Lab Results  Component Value Date   CKTOTAL 330* 12/01/2010   CKMB 7.4* 12/01/2010   TROPONINI <0.30 12/01/2010     No results found for this basename: CHOL   No results found for this basename: HDL   No results found for this basename: LDLCALC   No results found for this basename: TRIG   No results found for this basename: CHOLHDL   No results found for this basename: LDLDIRECT      Radiology:  Ct Hip Right Wo Contrast  11/29/2010  *RADIOLOGY REPORT*  Clinical Data: Fall  CT OF THE RIGHT HIP WITHOUT CONTRAST  Technique:  Multidetector CT imaging was performed according to the standard protocol. Multiplanar CT image reconstructions were also generated.  Comparison: None.  Findings: No acute fracture.  No dislocation.  Mild degenerative changes.  Unremarkable soft tissues other than  vascular calcifications. Right greater trochanter spurring.  IMPRESSION: No acute bony injury of the right femoral neck or acetabulum.  Original Report Authenticated By: Donavan Burnet, M.D.   EKG:  Performed on 11/30/2010 demonstrates nonspecific T wave abnormality, and normal sinus rhythm at a rate of 200 beats per minute. No pacemaker activity is noted.  Telemetry: I reviewed telemetry since admission and there are no episodes of bradycardia or indications of pacemaker malfunction .    Lesleigh Noe 12/01/2010 5:13 PM

## 2010-12-01 NOTE — Progress Notes (Signed)
CSW completed psychosocial assessment with the pts son. (Pls see shadow chart)  Pt was asleep during entire assessment. Pts son who is also POA would like pt to go to [facility] so she can be followed by her primary physician. CSW will complete FL-2 and place in pts chart. CSW will continue to follow.  Patrice Paradise, LCSWA 12/01/2010 1:13 PM [phone]

## 2010-12-01 NOTE — Progress Notes (Signed)
Assisted pt to [facility]. Began leaning forward.  Unable to sit up. Skin cool and dry. Grunt not actual words spoken to questions. Eyes open but no eye contact. Lasted 5-10 minutes.  Pt had loose BM.  Pt assisted back to bed with max assist.  Unable to obtain VS during episode.  After returning to bed, VS 94/55 HR 73.  MD notified.  New orders received. Will continue to monitor.

## 2010-12-01 NOTE — Progress Notes (Signed)
CRITICAL VALUE ALERT  Critical value received:  CK-MB 7.4  Date of notification:  12/01/10  Time of notification:  0140  Critical value read back:yes  Nurse who received alert:  Baldwin Crown  MD notified (1st page):  Dr. Debby Bud  Time of first page:  0140  MD notified (2nd page):  Time of second page:  Responding MD:  Dr. Debby Bud  Time MD responded:  [phone]

## 2010-12-01 NOTE — Progress Notes (Signed)
Last office pacer interrogation.:   Office Visit     Patient: Felicia West, Felicia West Provider: Armanda Magic, MD  DOB: 12-20-1916   Age: 77 Y   Sex: Female Date: 09/30/2010  Phone: [phone]  Address: [address] [place], [id number]  Pcp: Georgann Housekeeper    --------------------------------------------------------------------------------  Subjective:    CC:      1. TT/IN OFC PACER CHECK/DOI 10-24-02/DUAL ** CLOSE TO ERI.      HPI:      Medical History: Hypertension, CAD 2004 - LM 20%, LAD 50-60%, PDA 60-70%, non-Q. MI, 2004, Status post pacemaker, 2004, Dyslipidemia, no meds, GERD, Dizziness, basilar artery insufficiency, vestibular dysfunction, Osteoporosis, bone density 2000, Osteoarthritis of the knee and the back, Chronic HA, seen by Headache center, gapapentin not effective, intol, CKD stage .      Family History:       Social History:       Medications: None     Allergies: codeine: stomach upset, Egg/Pro: hives, Novocain: seizure, Penicillin G Benzathine: rash, Tricor: hives, gabapentin: intol.      Objective:    Examination:       Assessment:    Assessment:  1. Cardiac pacemaker - V45.01 (Primary)     Plan:    1. Cardiac pacemaker   Diagnostic Imaging:EC Check/Reprogram Pacemaker Normal, NO EPISODES. BATTERY LONGEVITY <5.0 YRS. LEAD IMPEDANCES: A 580 ohms RV 860 ohms. P WAVES 1.1mV R WAVES >12.52mV A THRESHOLD 1.1V @ 0.64ms RV THRESHOLD 0.9V @ 0.75ms AP 72% AND VP 21%. NO CHANGES MADE. WILL CONTINUE TO CLOSELY FOLLOW DUE TO DECREASED BATTERY LONGEVITY. Bowman,Katrina 09/30/2010 04:30:47 PM >           Immunizations:       Labs:      Procedure Codes: 47829 PACEMAKER CK DUAL W REPROGRAM     Preventive:           Follow Up: CLINIC APPT 12-05-10 @ 11:30 **CLOSE TO ERI        Provider: Armanda Magic, MD  Patient: Felicia West, Felicia West  DOB: 01-Sep-1916  Date: 09/30/2010

## 2010-12-01 NOTE — Progress Notes (Signed)
  Subjective: Pt c/o some rib pain.  Confusion intermittent due to dementia No CP, SOB Objective: Vital signs in last 24 hours: Temp:  [97.3 F (36.3 C)-98.5 F (36.9 C)] 98.5 F (36.9 C) (11/08 0647) Pulse Rate:  [70-90] 90  (11/08 0647) Resp:  [18-19] 18  (11/08 0647) BP: (112-138)/(65-75) 117/69 mmHg (11/08 0647) SpO2:  [95 %-99 %] 95 % (11/08 0647)   Intake/Output from previous day: 11/07 0701 - 11/08 0700 In: 360 [P.O.:360] Out: -     General appearance: alert Resp: clear to auscultation bilaterally Chest wall: no tenderness, left sided chest wall tenderness Cardio: regular rate and rhythm, S1, S2 normal, no murmur, click, rub or gallop Neurologic: confusion, pleasant. ; non focal;  Weakness,  Gait abormality  Lab Results:  Basename 11/30/10 0430 11/29/10 1658 11/29/10 1651  WBC 9.5 -- 12.2*  HGB 11.0* 13.3 --  HCT 33.4* 39.0 --  PLT 160 -- 175   BMET  Basename 11/30/10 0430 11/29/10 1658  NA 136 141  K 3.7 3.9  CL 102 108  CO2 26 --  GLUCOSE 121* 113*  BUN 23 21  CREATININE 1.32* 1.50*  CALCIUM 9.1 --   Lab Results  Component Value Date   ALT 11 02/11/2010   AST 24 02/11/2010   ALKPHOS 76 02/11/2010   BILITOT 0.9 02/11/2010    Assessment/Plan:  Principal Problem:  *LOC (loss of consciousness) No arrthymia; Cardiac monitor Active Problems:  Encephalopathy acute:  confusaion ; sundowning; will start on seqouel at bedtime   HTN (hypertension) BP is good; stay on same med  SSS (sick sinus syndrome) Pacemaker ok; mild elevated CPK- due to mild rhabdomyolysis  Hyperlipidemia  CAD (coronary artery disease)  Dementia Worsing; may need to start on med?  Fall  PT eval suggest rehab; [employer]  For SNF palcement; son in agreement  Rib fracture Pain control  Confusion state Due to dementia; sundowning- seroqul SNF placement/ rehab.   Felicia West 12/01/2010, 7:24 AM

## 2010-12-02 DIAGNOSIS — I951 Orthostatic hypotension: Secondary | ICD-10-CM

## 2010-12-02 LAB — URINALYSIS, ROUTINE W REFLEX MICROSCOPIC
Glucose, UA: NEGATIVE mg/dL
Hgb urine dipstick: NEGATIVE
Ketones, ur: 15 mg/dL — AB
Protein, ur: NEGATIVE mg/dL

## 2010-12-02 LAB — URINE MICROSCOPIC-ADD ON

## 2010-12-02 MED ORDER — SODIUM CHLORIDE 0.9 % IV SOLN
INTRAVENOUS | Status: AC
  Administered 2010-12-02 – 2010-12-03 (×3): via INTRAVENOUS

## 2010-12-02 MED ORDER — OLMESARTAN MEDOXOMIL 20 MG PO TABS
20.0000 mg | ORAL_TABLET | Freq: Every day | ORAL | Status: DC
  Administered 2010-12-02 – 2010-12-05 (×4): 20 mg via ORAL
  Filled 2010-12-02 (×4): qty 1

## 2010-12-02 NOTE — Progress Notes (Signed)
Pt had 16 beat run of SVT lasting 5.57 secs, VSS, 106/61, 97.7, 95% RA, MD notified, no new orders at this time.  Will continue to monitor.

## 2010-12-02 NOTE — Progress Notes (Signed)
Subjective:  Recent syncope, falls. Likely autonomic. Dementia severe. Sons are aware. Rib fx. No significant pain. No SOB.   Objective:  Vital Signs in the last 24 hours: Temp:  [97.6 F (36.4 C)-99.1 F (37.3 C)] 97.8 F (36.6 C) (11/09 1300) Pulse Rate:  [69-92] 78  (11/09 1339) Resp:  [14-20] 20  (11/09 1300) BP: (93-147)/(56-78) 121/69 mmHg (11/09 1420) SpO2:  [92 %-100 %] 98 % (11/09 1339)  Intake/Output from previous day: 11/08 0701 - 11/09 0700 In: 180 [P.O.:180] Out: 400 [Urine:400] Intake/Output from this shift: Total I/O In: 480 [P.O.:480] Out: 325 [Urine:325]  Physical Exam: Gen: demented. Sleepy in NAD CV: RRR Lungs: CTAB  Abd: soft Ext: no edema.   Lab Results:  Basename 11/30/10 0430 11/29/10 1658 11/29/10 1651  WBC 9.5 -- 12.2*  HGB 11.0* 13.3 --  PLT 160 -- 175    Basename 11/30/10 0430 11/29/10 1658  NA 136 141  K 3.7 3.9  CL 102 108  CO2 26 --  GLUCOSE 121* 113*  BUN 23 21  CREATININE 1.32* 1.50*    Basename 12/01/10 0032 11/30/10 1605  TROPONINI <0.30 <0.30    Cardiac Studies: Tele reviewed: occasional PAT, brief.  Pacer interrogation reviewed. Normal fn.  Assessment/Plan:  Principal Problem:  *LOC (loss of consciousness) - hypotension/ autonomic dysfunction likely etiol. Dr. Donette Larry managing.  Active Problems:  Encephalopathy acute - AMS  HTN (hypertension) - low BP. Keep pulling back meds.   SSS (sick sinus syndrome) - pacer function is normal. 6 months until ERI. Interrogated. We will continue to monitor at [facility].  SVT - brief episodes, not cause of syncope  CAD (coronary artery disease) - stable no angina.   Dementia - significant.   Fall  Rib fracture  Confusion state  Orthostatic hypotension - as above    Will sign off, call with ?  LOS: 3 days    SKAINS, MARK 12/02/2010, 4:20 PM

## 2010-12-02 NOTE — Progress Notes (Signed)
Physical Therapy Treatment Patient Details Name: Felicia West MRN: [id number] DOB: Oct 17, 1916 Today's Date: 12/02/2010 Time: 4098-1191 Charge: TA 2 PT Assessment/Plan  PT - Assessment/Plan Comments on Treatment Session: Son present for tx.  Pt hesitant to perform mobility 2* son reminding her of frequent syncopal episodes (pt does not recall these).  Elevated HOB to 40* for pt to adjust to more of sitting position prior to transferring EOB.  Mobility was progressed very slowing with at least 5-10 minutes between transfers for pt's body to adjust.  BP documented in vitals section.  Pt reported dizziness upon standing and was assisted back to trendelenberg position 2* becoming symptomatic of orthostatic hypotension (ie decreased cognition, pale face, dizziness).  Pt awake and alert verbalizing with son upon trendelenberg position.  Pt reported no pain or dizziness upon leaving room.  RN aware of drop in BP. PT Plan: Discharge plan remains appropriate;Frequency remains appropriate PT Goals  Acute Rehab PT Goals PT Goal: Supine/Side to Sit - Progress: Progressing toward goal PT Transfer Goal: Sit to Stand/Stand to Sit - Progress: Progressing toward goal PT Goal: Ambulate - Progress: Not met  PT Treatment Precautions/Restrictions  Precautions Precautions: Fall Precaution Comments: Left 6th rib fx Restrictions Weight Bearing Restrictions: No Mobility (including Balance) Bed Mobility Bed Mobility: Yes Supine to Sit: 4: Min assist;HOB elevated (Comment degrees) Supine to Sit Details (indicate cue type and reason): HOB 45*  assist of trunk2* pain with L rib cage from fx, pt used bilateral UEs to assist with transfer Sitting - Scoot to Edge of Bed: 4: Min assist;With rail Sitting - Scoot to [place] of Bed Details (indicate cue type and reason): verbal cue to hold rail on left and scoot forward with ride side Sit to Supine - Left: 2: Max assist Sit to Supine - Left Details (indicate cue type and  reason): Pt was maxA 2* drop in BP 93/60 in standing and pt with decreased responsiveness.   Transfers Transfers: Yes Sit to Stand: 3: Mod assist;From bed;With upper extremity assist Sit to Stand Details (indicate cue type and reason): bil UE assist and verbal cue to lean forward and perform knee extension.   Stand to Sit: 4: Min assist Stand to Sit Details: assist to lower to bed 2* to drop in BP    Exercise    End of Session PT - End of Session Activity Tolerance:  (Pt limited by drop in BP) Nurse Communication: Mobility status for transfers (Drop in BP with position changes.) General Behavior During Session: [facility] for tasks performed Cognition: Impaired Cognitive Impairment: Pt only orientated to self.  Not orientated to time, place, or situation.  Felicia West,KATHRYN 12/02/2010, 3:02 PM

## 2010-12-02 NOTE — Progress Notes (Signed)
Pt was assisted by nursing tech to sit up on side of bed, at this time pt became unresponsive, nurse arrive to room performing sternum rub, pt responded without opening eyes, at this time VS 147/78, 72, 96% 2L 97.7. Rapid Response nurse called to room rechecked BP 93/33, 72 after 5 min BP 100/43, 73, 97% 2L , RR 21 at 0600. Dr. Valentina Lucks notified of this event.  No new orders at this time.  Pt continues to rest at this time.Felicia West

## 2010-12-02 NOTE — Progress Notes (Signed)
Results of urinalysis called to Dr Rene Paci.

## 2010-12-02 NOTE — Progress Notes (Signed)
Rapid Response Note  Time Called: 0530 Time arrived: 0535  Initial assessment: Felicia West, Felicia West 77 y.o originally admitted for falls, code status DNR. Per [facility] pt had syncopal episode with CNA en route to [place], assisted back to bed quickly. Afterwards very lethargic, sternal rub needed for arouse. RRT Rn called to room. VS at 0535 72 bpm NSR, 122/98 (65), 21 RR, 96% on 2l [place]. Pt lethargic but answers to name and complains of ache in back and throat, moves all ext slowly. Dr Valentina Lucks paged to update at [phone] and [phone]. Per RN Maxine Glenn pt had similar episode earlier 11/8 at 1700, received seroquel and remeron last night 11/8 at bedtime. Call back received from Dr Valentina Lucks at [phone], spoke with [facility], updated on pt status, no orders received at this time, monitor for now to stay in room 1429.  End VS 0600: 71 bpm NSR,100/35 (51), 20 RR, 96% 2LNC,   Outcome: Monitor in room at this time 1429  MD called : Dr Valentina Lucks at 0535/0550, Call back received at [phone]

## 2010-12-02 NOTE — Progress Notes (Signed)
Pt lethargic, unable to obtain orthostatic vital signs at this time.Newman Nip [place]

## 2010-12-02 NOTE — Consult Note (Signed)
Reason for Consult: "Altered Mental Status"  HPI: Felicia West is an 77 y.o. Female who comes in due to syncope and having falling with a fracture of her left rib. She is getting a cardiac work-up. Neurology was called for increased confusion while in the hospital. Of note, the patient has had little PO intake in the hospital and has been dehydrated. She has also spent nights chatting with nurses and days sleeping and confused. Prior to hospital, she relied on her family for many activities of daily living, but was still well-organized with her hygiene intact.   Past Medical History  Diagnosis Date  . Hypertension   . Hyperlipidemia   . SSS (sick sinus syndrome)   . CAD (coronary artery disease)   . Dementia     Past Surgical History  Procedure Date  . Knee surgery   . Pacemaker insertion   . Cardiac catheterization   . Vascular surgery     Family History  Problem Relation Age of Onset  . Colon cancer Father     Social History:  reports that she has never smoked. She has never used smokeless tobacco. She reports that she does not drink alcohol or use illicit drugs.  Allergies:  Allergies  Allergen Reactions  . Lidocaine    Medications: I have reviewed the patient's current medications.  Blood pressure 120/66, pulse 85, temperature 97.6 F (36.4 C), temperature source Oral, resp. rate 19, height 5\' 2"  (1.575 m), weight 73.6 kg (162 lb 4.1 oz), SpO2 98.00%.  Neuro: patient very sleep, but arousable to mild sternal rub, will look and interact with examiner, complimented examiner's tie and asked where he got it, AAO*1 (thought that she was home), no obvious aphasia, was able to follow simple commands consistently, but not persistently attentive or awake enough to follow complex commands, PERRL on L, surgical pupil on R, EOMI, no facial asymmetry, tongue midline, no drift and grossly motor intact, intact sensation to pain throughout, no dysmetria on finger to nose bilaterally,  reflexes: 2+ UE b/l, 1+ at knees, trace at ankles, gait deferred.    Results for orders placed during the hospital encounter of 11/29/10 (from the past 48 hour(s))  CARDIAC PANEL(CRET KIN+CKTOT+MB+TROPI)     Status: Abnormal   Collection Time   11/30/10  4:05 PM      Component Value Range Comment   Total CK 269 (*) 7 - 177 (U/L)    CK, MB 5.4 (*) 0.3 - 4.0 (ng/mL)    Troponin I <0.30  <0.30 (ng/mL)    Relative Index 2.0  0.0 - 2.5    CARDIAC PANEL(CRET KIN+CKTOT+MB+TROPI)     Status: Abnormal   Collection Time   12/01/10 12:32 AM      Component Value Range Comment   Total CK 330 (*) 7 - 177 (U/L)    CK, MB 7.4 (*) 0.3 - 4.0 (ng/mL)    Troponin I <0.30  <0.30 (ng/mL)    Relative Index 2.2  0.0 - 2.5     Creatinine 1.32  CT head: no acute CVA, atrophy  Assessment/Plan: 77 years old woman with delerium due to multifactorial causes - she may have a mild underlying dementia, but I cannot assess for it at this moment (this will need to be done on outpatient basis once all of the current medical issues have been resolved). The reasons for he delerium include dehydration, unfamiliar surroundings, pain and her sleep-wake cycle being off. Recommend: rehydration, optimal pain control, keeping curtains open during the  day and night and psych consult for QHS meds, which are clearly not working. Seroquel is suboptimal in the elderly as it has anti-cholinergic side-effects and can add to confusion. Rule out other sources of infection - CXR. Call with questions.   Nashonda Limberg 12/02/2010, 12:00 PM

## 2010-12-02 NOTE — Progress Notes (Signed)
  Subjective: Pt with episode of syncope for few sec. Similar episode with sitting up from bed and yesterday while on commode Paced rhythm with breif few beat of SVT? Orthostatic- volume depeleted Confusion, slept with seroquel Low grade fever  Objective: Vital signs in last 24 hours: Temp:  [97.7 F (36.5 C)-100.1 F (37.8 C)] 97.7 F (36.5 C) (11/09 0530) Pulse Rate:  [72-92] 72  (11/09 0530) Resp:  [14-19] 18  (11/09 0530) BP: (94-147)/(55-78) 147/78 mmHg (11/09 0530) SpO2:  [90 %-96 %] 96 % (11/09 0530)   Intake/Output from previous day: 11/08 0701 - 11/09 0700 In: 180 [P.O.:180] Out: 400 [Urine:400]    General appearance: alert Resp: clear to auscultation bilaterally Cardio: regular rate and rhythm, S1, S2 normal, no murmur, click, rub or gallop Neurologic: Mental status: Alert, oriented, thought content appropriate, patient has confusion intermittently, she has slept well last night which Seqoquel  Lab Results:  Basename 11/30/10 0430 11/29/10 1658 11/29/10 1651  WBC 9.5 -- 12.2*  HGB 11.0* 13.3 --  HCT 33.4* 39.0 --  PLT 160 -- 175   BMET  Basename 11/30/10 0430 11/29/10 1658  NA 136 141  K 3.7 3.9  CL 102 108  CO2 26 --  GLUCOSE 121* 113*  BUN 23 21  CREATININE 1.32* 1.50*  CALCIUM 9.1 --   Lab Results  Component Value Date   ALT 11 02/11/2010   AST 24 02/11/2010   ALKPHOS 76 02/11/2010   BILITOT 0.9 02/11/2010    Assessment/Plan:  Principal Problem:  *LOC (loss of consciousness): Period of brief loss of consciousness last episode while she was sitting on the commode, again this morning when she was getting up from the bed. Hypotensive orthostatic, probable volume depletion. Also needs cardiology for pacemaker check. IV fluids. Decrease blood pressure medication. Active Problems:  Encephalopathy acute Confusion, sundowning  HTN (hypertension) Blood pressure is low, degrees blood pressure medication, discontinue Benicar  SSS (sick sinus  syndrome) Pacemaker check  Hyperlipidemia  CAD (coronary artery disease)  Dementia  Fall  Rib fracture Pain control adequate  Confusion state  Orthostatic hypotension IV fluids and decrease blood pressure medication, check urinalysis. Patient will be going to skilled care facility, keep her over the weekend to monitor her status  Rieley Khalsa 12/02/2010, 7:36 AM

## 2010-12-02 NOTE — Progress Notes (Signed)
  Addendum to Previous Progress Note: Pt resting quietly at this time. Call Light in reach, bed low.  End VS 0600: 71 bpm NSR,100/35 (51), 20 RR, 96% 2LNC,

## 2010-12-02 NOTE — Progress Notes (Signed)
CSW offered support to the patients son. Pt was asleep and did not arouse during visit. Pt son had several medical questions in regards to his mothers care at this point, and he was waiting on an IV to be started because the family feels as though she is dehydrated. Pts son encouraged to address these questions with the physician as well. CSW will continue to follow the pt and offer support to both the pt and family members. Patrice Paradise, LCSWA 12/02/2010 10:27 AM [phone]

## 2010-12-02 NOTE — Plan of Care (Signed)
Problem: Phase II Progression Outcomes Goal: Progress activity as tolerated unless otherwise ordered Outcome: Not Progressing Pt not progressing with activity secondary symptomatic drop in BP.

## 2010-12-03 LAB — CBC
HCT: 30.2 % — ABNORMAL LOW (ref 36.0–46.0)
Hemoglobin: 9.9 g/dL — ABNORMAL LOW (ref 12.0–15.0)
MCH: 29.2 pg (ref 26.0–34.0)
MCHC: 32.8 g/dL (ref 30.0–36.0)
MCV: 89.1 fL (ref 78.0–100.0)
Platelets: 142 10*3/uL — ABNORMAL LOW (ref 150–400)
RBC: 3.39 MIL/uL — ABNORMAL LOW (ref 3.87–5.11)
RDW: 13.3 % (ref 11.5–15.5)
WBC: 7 10*3/uL (ref 4.0–10.5)

## 2010-12-03 LAB — COMPREHENSIVE METABOLIC PANEL
Albumin: 2.6 g/dL — ABNORMAL LOW (ref 3.5–5.2)
Alkaline Phosphatase: 57 U/L (ref 39–117)
BUN: 32 mg/dL — ABNORMAL HIGH (ref 6–23)
Chloride: 108 mEq/L (ref 96–112)
Potassium: 3.5 mEq/L (ref 3.5–5.1)
Total Bilirubin: 1.1 mg/dL (ref 0.3–1.2)

## 2010-12-03 NOTE — Progress Notes (Signed)
Subjective: Patient is resting, she awakens but really does not want to be bothered. She denies any complaint of fever chills chest pain shortness of breath. [facility] neurology and cardiology have been reviewed. Case has been discussed with the son.  Objective: Vital signs in last 24 hours: Temp:  [97.8 F (36.6 C)-98.3 F (36.8 C)] 97.9 F (36.6 C) (11/10 0457) Pulse Rate:  [63-78] 76  (11/10 0457) Resp:  [18-20] 18  (11/10 0457) BP: (93-149)/(56-76) 149/73 mmHg (11/10 0457) SpO2:  [98 %-100 %] 99 % (11/09 2217) Weight change:  Last BM Date: 12/02/10  Intake/Output from previous day: 11/09 0701 - 11/10 0700 In: 3035 [P.O.:1060; I.V.:1975] Out: 1250 [Urine:1250] Intake/Output this shift:    General appearance: no distress Resp: clear to auscultation bilaterally Cardio: regular rate and rhythm, S1, S2 normal, no murmur, click, rub or gallop Extremities: extremities normal, atraumatic, no cyanosis or edema  Lab Results:  [facility] 12/03/10 0550  WBC 7.0  HGB 9.9*  HCT 30.2*  PLT 142*   BMET  Basename 12/03/10 0550  NA 141  K 3.5  CL 108  CO2 26  GLUCOSE 97  BUN 32*  CREATININE 1.32*  CALCIUM 9.1    Studies/Results: No results found.  Medications:  Scheduled:   . aspirin EC  325 mg Oral Daily  . enoxaparin  40 mg Subcutaneous Daily  . metoprolol succinate  25 mg Oral Daily  . mirtazapine  15 mg Oral QHS  . olmesartan  20 mg Oral Daily  . QUEtiapine  25 mg Oral QHS    Assessment/Plan: Syncope, etiology undetermined at this time, currently paced has been evaluated without any pathology, patient has been seen by neurology, rule out secondary to dehydration and dementia. UA with schedule, report currently not available, if not drawn will repeat UA CNS. We will continue IV fluids until tomorrow and make further recommendations.  LOS: 4 days   Suhaylah Wampole D 12/03/2010, 12:08 PM

## 2010-12-04 NOTE — Progress Notes (Signed)
Spoke with Pt's son regarding d/c tomorrow.  Answered questions regarding d/c procedure, ins and facility.  Son thanked CSW for assistance and time.  [place], [place] [phone]  Weekend coverage

## 2010-12-04 NOTE — Progress Notes (Signed)
Subjective: Patient is a lot more alert today pleasantly confused without complaint, according to the son she's eating. She does have some chest wall discomfort in the area of the known rib fracture on the left.  Objective: Vital signs in last 24 hours: Temp:  [97.3 F (36.3 C)-98 F (36.7 C)] 97.3 F (36.3 C) (11/11 0503) Pulse Rate:  [72-98] 98  (11/11 0507) Resp:  [18] 18  (11/11 0503) BP: (115-148)/(63-81) 115/63 mmHg (11/11 0507) SpO2:  [97 %-99 %] 99 % (11/11 0503) Weight change:  Last BM Date: 12/02/10  Intake/Output from previous day: 11/10 0701 - 11/11 0700 In: 480 [P.O.:480] Out: 1400 [Urine:1400] Intake/Output this shift: Total I/O In: 240 [P.O.:240] Out: -   General appearance: alert and appears stated age Resp: clear to auscultation bilaterally Cardio: regular rate and rhythm, S1, S2 normal, no murmur, click, rub or gallop Extremities: extremities normal, atraumatic, no cyanosis or edema  Lab Results:  [facility] 12/03/10 0550  WBC 7.0  HGB 9.9*  HCT 30.2*  PLT 142*   BMET  Basename 12/03/10 0550  NA 141  K 3.5  CL 108  CO2 26  GLUCOSE 97  BUN 32*  CREATININE 1.32*  CALCIUM 9.1    Studies/Results: No results found.  Medications:  Scheduled:   . aspirin EC  325 mg Oral Daily  . enoxaparin  40 mg Subcutaneous Daily  . metoprolol succinate  25 mg Oral Daily  . mirtazapine  15 mg Oral QHS  . olmesartan  20 mg Oral Daily  . QUEtiapine  25 mg Oral QHS    Assessment/Plan: Syncope no recurrence probable secondary to dehydration, UA is minimally abnormal however the patient's afebrile, cultures pending, consider empiric Cipro pending culture Cognitive dysfunction Left rib fracture   LOS: 5 days   Felicia West D 12/04/2010, 9:48 AM

## 2010-12-05 MED ORDER — QUETIAPINE FUMARATE 25 MG PO TABS: 25.0000 mg | ORAL_TABLET | Freq: Every day | ORAL | Status: DC

## 2010-12-05 MED ORDER — IRBESARTAN 300 MG PO TABS: 150.0000 mg | ORAL_TABLET | Freq: Every day | ORAL | Status: DC

## 2010-12-05 MED ORDER — ACETAMINOPHEN 500 MG PO TABS: 500.0000 mg | ORAL_TABLET | Freq: Four times a day (QID) | ORAL | Status: AC | PRN

## 2010-12-05 NOTE — Progress Notes (Signed)
CSW spoke with the pts son concerning his mother discharging today. Pt will go to [facility] and her family feels as though it may turn into long term care. Pts son had questions about the cost of long term care, CSW provided him with the contact name and number of the correct person at the facility. CSW faxed over discharge summary to Janie at [facility] and is waiting on a return call verifying that the pt is okay to come to the facility. CSW also informed the pts nurse Shavon that the CSW was preparing documentation for pt to transfer to SNF. CSW will continue to follow.  Patrice Paradise, LCSWA 12/05/2010 10:02 AM [phone]

## 2010-12-05 NOTE — Progress Notes (Signed)
CSW spoke with pt and pts 2 sons, and daughter in law regarding pt discharging. CSW has pts chart copy ready to be transported with pt. Pts son reports he received a call back from [facility] and they answered the questions he had regarding long term care. Pts son who is also POA has left the hospital to go sign the paperwork. CSW called [facility] at 10:55 to arrange transportation, pt is dressed and ready to d/c.  CSW called Janie at [facility] to inform here that [facility] was called. CSW signing off.  Patrice Paradise, LCSWA 12/05/2010 11:01 AM [phone]

## 2010-12-05 NOTE — Discharge Summary (Signed)
Physician Discharge Summary  Patient ID: Felicia West MRN: [id number] DOB/AGE: [birth year] 77 y.o.  Admit date: 11/29/2010 Discharge date: 12/05/2010  Admission Diagnoses:  Discharge Diagnoses:  Principal Problem:  *LOC (loss of consciousness) Active Problems:  Encephalopathy acute  HTN (hypertension)  SSS (sick sinus syndrome)  Hyperlipidemia  CAD (coronary artery disease)  Dementia  Fall  Rib fracture  Confusion state  Orthostatic hypotension   Discharged Condition: good  Hospital Course:  Patient is 77 years old female admitted with loss of consciousness, syncope, fall with left refracture and confusion. Syncope: She was on telemetry with a paced rhythm. Cardiology was consulted. No evidence of any acute ischemia. Her pacemaker was checked with 6 months of battery life. Ruled out by cardiac markers. No cardiac workup at this point.  Acute delirium encephalopathy, confusion. Multifactorial, neurology consulted agreed with having change in environment, with marked underlying dementia. She was started on medications for her sleep, did well with this. May need outpatient evaluation for dementia. Her blood work remained unremarkable. Urine was negative chest x-ray was negative head CT was negative.  Hypertension: Her blood pressure remained low she was orthostatic and she had episode of passing out briefly but she was sitting up on the commode during the hospital course. Her blood pressure medication was changed, Avapro was decreased to 150 mg daily, Norvasc was discontinued. Continued on beta blockers. History of CAD: Continue Junior on beta blocker cker and aspirin no cardiac evaluation further.  History of sick sinus syndrome: Pacemaker was checked she has 6 months of battery she will followup with [facility] cardiology for pacemaker check. Left rib fracture: Pain control without mouth, tramadol w hen necessary.  Orthostatic changes continue on blood pressure  medication.  Consults: cardiology and neurology  Significant Diagnostic Studies: labs:   and radiology: CXR: Left refracture nondisplaced . X-Ray: Knee x-ray negative; CT of the cervical spine negative. Hip x-ray negative for fracture  and CT scan: CT of the head negative, x-rays of the knee, negative   Treatments: IV hydration  Discharge Exam: Blood pressure 150/79, pulse 71, temperature 97.9 F (36.6 C), temperature source Axillary, resp. rate 18, height 5\' 2"  (1.575 m), weight 73.6 kg (162 lb 4.1 oz), SpO2 95.00%. General appearance: alert Resp: clear to auscultation bilaterally Chest wall: no tenderness, left sided chest wall tenderness Cardio: regular rate and rhythm, S1, S2 normal, no murmur, click, rub or gallop Neurologic: Mental status: Alert, confusaion intermittent Gait:unsteady   Disposition: SNF  Discharge Orders    Future Orders Please Complete By Expires   Diet - low sodium heart healthy      Increase activity slowly        Current Discharge Medication List    START taking these medications   Details  acetaminophen (TYLENOL) 500 MG tablet Take 1 tablet (500 mg total) by mouth every 6 (six) hours as needed (pain). Qty: 30 tablet, Refills: 0    QUEtiapine (SEROQUEL) 25 MG tablet Take 1 tablet (25 mg total) by mouth at bedtime. Qty: 30 tablet, Refills: 0      CONTINUE these medications which have CHANGED   Details  irbesartan (AVAPRO) 300 MG tablet Take 0.5 tablets (150 mg total) by mouth daily. Qty: 30 tablet, Refills: 0      CONTINUE these medications which have NOT CHANGED   Details  amLODipine (NORVASC) 5 MG tablet Take 5 mg by mouth daily.      aspirin EC 325 MG tablet Take 325 mg by mouth daily.  cholecalciferol (VITAMIN D) 1000 UNITS tablet Take 1,000 Units by mouth daily.      ibuprofen (ADVIL,MOTRIN) 200 MG tablet Take 200 mg by mouth every 8 (eight) hours as needed. For pain.     metoprolol succinate (TOPROL-XL) 25 MG 24 hr tablet  Take 25 mg by mouth daily.      mirtazapine (REMERON) 15 MG tablet Take 15 mg by mouth at bedtime.      Multiple Vitamins-Minerals (MULTIVITAMINS THER. W/MINERALS) TABS Take 1 tablet by mouth daily.      traMADol (ULTRAM) 50 MG tablet Take 50 mg by mouth 2 (two) times daily as needed. For pain.     vitamin E 400 UNIT capsule Take 400 Units by mouth daily.           SignedGeorgann Housekeeper 12/05/2010, 7:28 AM

## 2011-02-14 ENCOUNTER — Encounter: Payer: Self-pay | Admitting: Internal Medicine

## 2011-03-07 ENCOUNTER — Encounter: Payer: Self-pay | Admitting: Internal Medicine

## 2011-03-08 ENCOUNTER — Ambulatory Visit (INDEPENDENT_AMBULATORY_CARE_PROVIDER_SITE_OTHER): Payer: MEDICARE | Admitting: Internal Medicine

## 2011-03-08 ENCOUNTER — Encounter: Payer: Self-pay | Admitting: Internal Medicine

## 2011-03-08 DIAGNOSIS — Z950 Presence of cardiac pacemaker: Secondary | ICD-10-CM

## 2011-03-08 DIAGNOSIS — I10 Essential (primary) hypertension: Secondary | ICD-10-CM

## 2011-03-08 NOTE — Assessment & Plan Note (Signed)
Her device has reached elective replacement. I discussed the risks, goals, benefits, and expectations of pacemaker generator removal and insertion of a new device and she wishes to proceed.

## 2011-03-08 NOTE — Assessment & Plan Note (Signed)
Her blood pressure is well controlled. She will continue her current medical therapy. 

## 2011-03-08 NOTE — Progress Notes (Signed)
HPI Felicia West is referred today for pacemaker generator removal and insertion of a new device. The patient is a very pleasant 78 year old woman with symptomatic bradycardia and underwent permanent pacemaker insertion over 8 years ago. She has reached elective replacement indication. She denies chest pain, shortness of breath, but does have very mild peripheral edema. The patient denies syncope. She has advanced dementia and has trouble with her memory. Despite this she is very awake today and appropriate.  Allergies  Allergen Reactions  . Lidocaine      Current Outpatient Prescriptions  Medication Sig Dispense Refill  . aspirin EC 325 MG tablet Take 325 mg by mouth daily.        . Calcium Carbonate-Vitamin D (CALCIUM 600+D) 600-200 MG-UNIT TABS Take by mouth daily.      . cholecalciferol (VITAMIN D) 1000 UNITS tablet Take 1,000 Units by mouth daily.        Marland Kitchen escitalopram (LEXAPRO) 5 MG tablet Take 5 mg by mouth daily.      . irbesartan (AVAPRO) 300 MG tablet Take 0.5 tablets (150 mg total) by mouth daily.  30 tablet  0  . metoprolol succinate (TOPROL-XL) 25 MG 24 hr tablet Take 25 mg by mouth daily.        . mirtazapine (REMERON) 15 MG tablet Take 7.5 mg by mouth at bedtime.       . Multiple Vitamins-Minerals (MULTIVITAMINS THER. W/MINERALS) TABS Take 1 tablet by mouth daily.        . QUEtiapine (SEROQUEL) 25 MG tablet Take 25 mg by mouth at bedtime.      . traMADol (ULTRAM) 50 MG tablet Take 50 mg by mouth every 6 (six) hours as needed. For pain.      . vitamin E 400 UNIT capsule Take 400 Units by mouth daily.           Past Medical History  Diagnosis Date  . Hypertension   . Hyperlipidemia   . SSS (sick sinus syndrome)   . CAD (coronary artery disease)   . Dementia   . MI (myocardial infarction)   . GERD (gastroesophageal reflux disease)   . Diverticulosis   . Osteoporosis     ROS:   All systems reviewed and negative except as noted in the HPI.   Past Surgical History    Procedure Date  . Knee surgery   . Pacemaker insertion   . Cardiac catheterization 09/09/02  . Vascular surgery   . Cholecystectomy   . Colectomy      Family History  Problem Relation Age of Onset  . Colon cancer Father   . Stroke Brother   . Pneumonia Mother      History   Social History  . Marital Status: Widowed    Spouse Name: N/A    Number of Children: N/A  . Years of Education: N/A   Occupational History  . Not on file.   Social History Main Topics  . Smoking status: Never Smoker   . Smokeless tobacco: Never Used  . Alcohol Use: No  . Drug Use: No  . Sexually Active: Not Currently   Other Topics Concern  . Not on file   Social History Narrative  . No narrative on file     BP 110/50  Pulse 68  Wt 73.084 kg (161 lb 1.9 oz)  SpO2 92%  Physical Exam:  Well appearing elderly woman, NAD HEENT: Unremarkable Neck:  No JVD, no thyromegally Lungs:  Clear with no wheezes, rales, or  rhonchi. Well-healed pacemaker incision. HEART:  Regular rate rhythm, no murmurs, no rubs, no clicks Abd:  soft, positive bowel sounds, no organomegally, no rebound, no guarding Ext:  2 plus pulses, no edema, no cyanosis, no clubbing Skin:  No rashes no nodules Neuro:  CN II through XII intact, motor grossly intact  DEVICE  Normal device function.  See PaceArt for details.   Assess/Plan:

## 2011-03-08 NOTE — Patient Instructions (Signed)
Patient to have a pacemaker generator change out done  Son to call me back with dates that are good for him  Given an order for blood work to be done one week prior to procedure

## 2011-03-13 ENCOUNTER — Telehealth: Payer: Self-pay | Admitting: Internal Medicine

## 2011-03-13 NOTE — Telephone Encounter (Signed)
Pt's son calling re scheduling pt's surgery 03-30-11 work well, or 04-12-11 , pls call

## 2011-03-13 NOTE — Telephone Encounter (Signed)
Pt is at [facility] and does not do early mornings well  I offered 7:30am and that was not good.  He wants afternoon.  I explained to him that I would discuss with Dr Ladona Ridgel and call him back on Thurs with time  Will have labs drawn on 03/23/11

## 2011-03-16 ENCOUNTER — Encounter: Payer: Self-pay | Admitting: *Deleted

## 2011-03-16 NOTE — Telephone Encounter (Signed)
Be at the hospital at 11:00am.  Nothing to eat or drink after midnight

## 2011-03-16 NOTE — Telephone Encounter (Signed)
F/U   Patient son Felicia West [phone]  Would like a return call regarding status and start time for surgery.  Please return his call

## 2011-03-17 ENCOUNTER — Telehealth: Payer: Self-pay | Admitting: Internal Medicine

## 2011-03-17 NOTE — Telephone Encounter (Signed)
They would like instructions for procedure and what  meds to hold

## 2011-03-17 NOTE — Telephone Encounter (Signed)
Instructions faxed to [phone]

## 2011-03-23 ENCOUNTER — Encounter: Payer: Self-pay | Admitting: Internal Medicine

## 2011-03-29 ENCOUNTER — Other Ambulatory Visit: Payer: Self-pay | Admitting: *Deleted

## 2011-03-29 DIAGNOSIS — I495 Sick sinus syndrome: Secondary | ICD-10-CM

## 2011-03-29 MED ORDER — SODIUM CHLORIDE 0.9 % IV SOLN
INTRAVENOUS | Status: DC
  Administered 2011-03-30: 12:00:00 via INTRAVENOUS

## 2011-03-29 MED ORDER — CEFAZOLIN SODIUM 1-5 GM-% IV SOLN: 1.0000 g | INTRAVENOUS | Status: DC

## 2011-03-29 MED ORDER — CHLORHEXIDINE GLUCONATE 4 % EX LIQD
60.0000 mL | Freq: Once | CUTANEOUS | Status: DC
  Filled 2011-03-29: qty 60

## 2011-03-29 MED ORDER — SODIUM CHLORIDE 0.9 % IR SOLN
80.0000 mg | Status: DC
  Filled 2011-03-29: qty 2

## 2011-03-30 ENCOUNTER — Ambulatory Visit (HOSPITAL_COMMUNITY): Payer: MEDICARE

## 2011-03-30 ENCOUNTER — Encounter (HOSPITAL_COMMUNITY)
Admission: RE | Disposition: A | Discharge status: Home / Self Care | Payer: Self-pay | Source: Ambulatory Visit | Attending: Internal Medicine

## 2011-03-30 ENCOUNTER — Ambulatory Visit (HOSPITAL_COMMUNITY)
Admission: RE | Admit: 2011-03-30 | Discharge: 2011-03-30 | Disposition: A | Discharge status: Home / Self Care | Payer: MEDICARE | Source: Ambulatory Visit | Attending: Internal Medicine | Admitting: Internal Medicine

## 2011-03-30 DIAGNOSIS — I701 Atherosclerosis of renal artery: Secondary | ICD-10-CM | POA: Insufficient documentation

## 2011-03-30 DIAGNOSIS — I498 Other specified cardiac arrhythmias: Secondary | ICD-10-CM

## 2011-03-30 DIAGNOSIS — E785 Hyperlipidemia, unspecified: Secondary | ICD-10-CM | POA: Insufficient documentation

## 2011-03-30 DIAGNOSIS — I252 Old myocardial infarction: Secondary | ICD-10-CM | POA: Insufficient documentation

## 2011-03-30 DIAGNOSIS — I495 Sick sinus syndrome: Secondary | ICD-10-CM

## 2011-03-30 DIAGNOSIS — Z45018 Encounter for adjustment and management of other part of cardiac pacemaker: Secondary | ICD-10-CM | POA: Insufficient documentation

## 2011-03-30 DIAGNOSIS — K219 Gastro-esophageal reflux disease without esophagitis: Secondary | ICD-10-CM | POA: Insufficient documentation

## 2011-03-30 DIAGNOSIS — I10 Essential (primary) hypertension: Secondary | ICD-10-CM | POA: Insufficient documentation

## 2011-03-30 HISTORY — PX: PACEMAKER GENERATOR CHANGE: SHX5481

## 2011-03-30 LAB — SURGICAL PCR SCREEN: Staphylococcus aureus: NEGATIVE

## 2011-03-30 LAB — PROTIME-INR
INR: 1.05 (ref 0.00–1.49)
Prothrombin Time: 13.9 seconds (ref 11.6–15.2)

## 2011-03-30 SURGERY — PACEMAKER GENERATOR CHANGE
Anesthesia: LOCAL

## 2011-03-30 MED ORDER — HEPARIN (PORCINE) IN NACL 2-0.9 UNIT/ML-% IJ SOLN
INTRAMUSCULAR | Status: AC
  Filled 2011-03-30: qty 1000

## 2011-03-30 MED ORDER — SODIUM CHLORIDE 0.9 % IV SOLN: INTRAVENOUS | Status: DC

## 2011-03-30 MED ORDER — CHLOROPROCAINE HCL 1 % IJ SOLN
30.0000 mL | Freq: Once | INTRAMUSCULAR | Status: DC
  Filled 2011-03-30: qty 30

## 2011-03-30 MED ORDER — VANCOMYCIN HCL IN DEXTROSE 1-5 GM/200ML-% IV SOLN
1000.0000 mg | Freq: Once | INTRAVENOUS | Status: DC
  Filled 2011-03-30: qty 200

## 2011-03-30 MED ORDER — FENTANYL CITRATE 0.05 MG/ML IJ SOLN
INTRAMUSCULAR | Status: AC
  Filled 2011-03-30: qty 2

## 2011-03-30 MED ORDER — MUPIROCIN 2 % EX OINT
TOPICAL_OINTMENT | CUTANEOUS | Status: AC
  Administered 2011-03-30: 1 via NASAL
  Filled 2011-03-30: qty 22

## 2011-03-30 MED ORDER — ONDANSETRON HCL 4 MG/2ML IJ SOLN: 4.0000 mg | Freq: Four times a day (QID) | INTRAMUSCULAR | Status: DC | PRN

## 2011-03-30 MED ORDER — CEFAZOLIN SODIUM 1-5 GM-% IV SOLN
INTRAVENOUS | Status: AC
  Filled 2011-03-30: qty 50

## 2011-03-30 MED ORDER — VANCOMYCIN HCL IN DEXTROSE 1-5 GM/200ML-% IV SOLN
INTRAVENOUS | Status: AC
  Filled 2011-03-30: qty 200

## 2011-03-30 MED ORDER — ACETAMINOPHEN 325 MG PO TABS: 325.0000 mg | ORAL_TABLET | ORAL | Status: DC | PRN

## 2011-03-30 MED ORDER — MIDAZOLAM HCL 2 MG/2ML IJ SOLN
INTRAMUSCULAR | Status: AC
  Filled 2011-03-30: qty 2

## 2011-03-30 MED ORDER — MUPIROCIN 2 % EX OINT
TOPICAL_OINTMENT | Freq: Once | CUTANEOUS | Status: AC
  Administered 2011-03-30: 1 via NASAL
  Filled 2011-03-30: qty 22

## 2011-03-30 NOTE — H&P (View-Only) (Signed)
HPI Felicia West is referred today for pacemaker generator removal and insertion of a new device. The patient is a very pleasant 78-year-old woman with symptomatic bradycardia and underwent permanent pacemaker insertion over 8 years ago. She has reached elective replacement indication. She denies chest pain, shortness of breath, but does have very mild peripheral edema. The patient denies syncope. She has advanced dementia and has trouble with her memory. Despite this she is very awake today and appropriate.  Allergies  Allergen Reactions  . Lidocaine      Current Outpatient Prescriptions  Medication Sig Dispense Refill  . aspirin EC 325 MG tablet Take 325 mg by mouth daily.        . Calcium Carbonate-Vitamin D (CALCIUM 600+D) 600-200 MG-UNIT TABS Take by mouth daily.      . cholecalciferol (VITAMIN D) 1000 UNITS tablet Take 1,000 Units by mouth daily.        . escitalopram (LEXAPRO) 5 MG tablet Take 5 mg by mouth daily.      . irbesartan (AVAPRO) 300 MG tablet Take 0.5 tablets (150 mg total) by mouth daily.  30 tablet  0  . metoprolol succinate (TOPROL-XL) 25 MG 24 hr tablet Take 25 mg by mouth daily.        . mirtazapine (REMERON) 15 MG tablet Take 7.5 mg by mouth at bedtime.       . Multiple Vitamins-Minerals (MULTIVITAMINS THER. W/MINERALS) TABS Take 1 tablet by mouth daily.        . QUEtiapine (SEROQUEL) 25 MG tablet Take 25 mg by mouth at bedtime.      . traMADol (ULTRAM) 50 MG tablet Take 50 mg by mouth every 6 (six) hours as needed. For pain.      . vitamin E 400 UNIT capsule Take 400 Units by mouth daily.           Past Medical History  Diagnosis Date  . Hypertension   . Hyperlipidemia   . SSS (sick sinus syndrome)   . CAD (coronary artery disease)   . Dementia   . MI (myocardial infarction)   . GERD (gastroesophageal reflux disease)   . Diverticulosis   . Osteoporosis     ROS:   All systems reviewed and negative except as noted in the HPI.   Past Surgical History    Procedure Date  . Knee surgery   . Pacemaker insertion   . Cardiac catheterization 09/09/02  . Vascular surgery   . Cholecystectomy   . Colectomy      Family History  Problem Relation Age of Onset  . Colon cancer Father   . Stroke Brother   . Pneumonia Mother      History   Social History  . Marital Status: Widowed    Spouse Name: N/A    Number of Children: N/A  . Years of Education: N/A   Occupational History  . Not on file.   Social History Main Topics  . Smoking status: Never Smoker   . Smokeless tobacco: Never Used  . Alcohol Use: No  . Drug Use: No  . Sexually Active: Not Currently   Other Topics Concern  . Not on file   Social History Narrative  . No narrative on file     BP 110/50  Pulse 68  Wt 73.084 kg (161 lb 1.9 oz)  SpO2 92%  Physical Exam:  Well appearing elderly woman, NAD HEENT: Unremarkable Neck:  No JVD, no thyromegally Lungs:  Clear with no wheezes, rales, or   rhonchi. Well-healed pacemaker incision. HEART:  Regular rate rhythm, no murmurs, no rubs, no clicks Abd:  soft, positive bowel sounds, no organomegally, no rebound, no guarding Ext:  2 plus pulses, no edema, no cyanosis, no clubbing Skin:  No rashes no nodules Neuro:  CN II through XII intact, motor grossly intact  DEVICE  Normal device function.  See PaceArt for details.   Assess/Plan:   

## 2011-03-30 NOTE — Interval H&P Note (Signed)
History and Physical Interval Note:  03/30/2011 12:29 PM  Felicia West  has presented today for surgery, with the diagnosis of End of life  The various methods of treatment have been discussed with the patient and family. After consideration of risks, benefits and other options for treatment, the patient has consented to  Procedure(s) (LRB): PACEMAKER GENERATOR CHANGE (N/A) as a surgical intervention .  The patients' history has been reviewed, patient examined, no change in status, stable for surgery.  I have reviewed the patients' chart and labs.  Questions were answered to the patient's satisfaction.     Lewayne Bunting

## 2011-03-30 NOTE — Op Note (Signed)
DDD PM generator removal and insertion of a new device without immediate complication.[id number].

## 2011-03-30 NOTE — Discharge Instructions (Signed)
Pacemaker Battery Change A pacemaker battery usually lasts 4 to 12 years. Once or twice per year, you will be asked to visit your caregiver to have a full evaluation of your pacemaker. When a battery needs to be replaced, the entire pacemaker is actually replaced so that you can benefit from new circuitry and any new features that have recently been added to pacemakers. Most often, this procedure is very simple because the leads are already in place. After giving medicine to numb the skin, your health care provider makes a cut to reopen the pocket holding the pacemaker and disconnects the old device from its leads. The leads are routinely tested at this time. If they are working okay, the new pacemaker may simply be connected to the existing leads. If there is any problem with the old lead system, it may be wise to replace the lead system while inserting the new pacemaker. There are many things that affect how long a pacemaker battery will last:  Age of the pacemaker.   Number of leads (1, 2 or 3).   Pacemaker work load. If the pacemaker is helping the heart more often, then the battery will not last as long as if the pacemaker does not need to help the heart.   Resistance of the leads. The greater the resistance, the greater the drain on the battery. This can increase as the leads get older or if one or more of the leads does not have the best contact with the heart.   Power (voltage) settings.   The health of the person's heart. If the health of the heart gets worse, then the pacemaker may have to work more often and the setting changed to accommodate these changes.  Your health care provider will be alerted to the fact that it is time to replace the battery during follow-up exams. He or she will check your pacemaker using a small table-top computer, called a programmer, and a wand. The wand is about the same size as a remote control. Your provider puts the wand on your body in the area where the  pacemaker is located. Information from the pacemaker is received about how well your heart is working and the status of the battery. It is not painful, and it usually takes just a few minutes. You will have plenty of time before the battery is fully used up to plan for replacement.  LET YOUR CAREGIVER KNOW ABOUT:   Symptoms of chest pain, trouble breathing, palpitations, lightheadedness, or feelings of an abnormal or irregular heart beat.   Allergies.   Medications taken including herbs, eye drops, over the counter medications, and creams   Use of steroids (by mouth or creams).   Possible pregnancy, if applicable.   Previous problems with anesthetics or Novocaine.   History of blood clots (thrombophlebitis).   History of bleeding or blood problems.   Surgery since your last pacemaker placement.   Other health problems.  RISKS AND COMPLICATIONS These are very uncommon but include:  Bleeding.   Bruising of the skin around where the incision was made.   Pain at the site of the incision.   Pulling apart of the skin at the incision site.   Infection.   Allergic reaction to anesthetics or medicines used during the procedure.  Diabetics may have a temporary increase in their blood sugar after any surgical procedure.  BEFORE THE PROCEDURE  Wash all of the skin around the area of the chest where the pacemaker is located.   Try to remove any loose, scaling skin. Unless advised otherwise, avoid using aspirin, ibuprofen, or naproxen for 3-4 days before the procedure. Ask your caregiver for help with any other medication adjustments before the pacemaker is replaced. Unless advised otherwise, do not eat or drink after midnight on the night before the procedure EXCEPT for drinking water and taking your medications as you normally would. AFTER THE PROCEDURE   A heart monitor and the pacemaker programmer will be used to make sure that the new pacemaker is working properly.   You can go home  after the procedure.   Your caregiver will advise you if you need to have any stitches. They will be removed 5-7 days after the procedure.  HOME CARE INSTRUCTIONS   Keep the incision clean and dry.   Unless advised otherwise, you may shower after carefully covering the incision with plastic wrap that is taped to your chest.   For the first week after the replacement, avoid stretching motions that pull at the incision site and avoid heavy exercise with the arm on the same side as the incision.   Only take over-the-counter or prescription medicines for pain, discomfort, or fever as directed by your caregiver.   Your caregiver will tell you when you will need to next test your pacemaker by telephone or when to return to the office for re-exam and/or removal of stitches, if necessary.  SEEK MEDICAL CARE IF:   You have unusual pain at the incision site that is not adequately helped by over-the-counter or prescription medicine.   There is drainage or pus from the incision site.   You develop red streaking that extends above or below the incision site.   You feel brief intermittent palpitations, lightheadedness or any symptoms that you feel might be related to your heart.  SEEK IMMEDIATE MEDICAL CARE IF:   You experience chest pain that is different than the pain at the incision site.   You experience:   Shortness of breath.   Palpitations.   Irregular heart beat.   Lightheadedness that does not go away quickly.   Fainting.   You develop a fever.   You have pain that gets worse even though you are taking pain medicine.  MAKE SURE YOU:   Understand these instructions.   Will watch your condition.   Will get help right away if you are not doing well or get worse.  Document Released: 04/19/2006 Document Revised: 12/29/2010 Document Reviewed: 07/23/2006 [employer] Patient Information 2012 [employer]. 

## 2011-03-31 MED FILL — Cefazolin in D5W Inj 1 GM/50ML: INTRAVENOUS | Qty: 50 | Status: AC

## 2011-03-31 NOTE — Op Note (Signed)
Felicia West, HOES NO.:  [id number]  MEDICAL RECORD NO.:  [id number]  LOCATION:  [facility]                         FACILITY:  [facility]  PHYSICIAN:  Doylene Canning. Ladona Ridgel, MD    DATE OF BIRTH:  05/25/1916  DATE OF PROCEDURE:  03/30/2011 DATE OF DISCHARGE:  03/30/2011                              OPERATIVE REPORT   PROCEDURE PERFORMED:  Removal of previously implanted dual-chamber pacemaker, which had reached elective replacement indication followed by insertion of a new dual-chamber pacemaker.  INTRODUCTION:  The patient is a very pleasant 78 year old woman with a history of symptomatic bradycardia, status post pacemaker insertion. She is now referred for removal of her old pacemaker and insertion of a new one as she has had reached elective replacement indication.  PROCEDURE:  After informed consent was obtained, the patient was taken to the diagnostic EP lab in a fasting state.  After usual preparation and draping, intravenous fentanyl and midazolam was given for sedation. Lidocaine 30 mL was infiltrated into the left infraclavicular region.  A 5-cm incision was carried out over this region and electrocautery was utilized to dissect down to the pacemaker pocket.  The pocket was entered without difficulty and the generator was removed without difficulty.  The atrial and ventricular leads were found to be working satisfactorily.  The new [employer] Advantio dual-chamber pacemaker, serial number [id number] was connected to the atrial and RV leads and placed back in the subcutaneous pocket.  The pocket was irrigated with antibiotic irrigation and the incision was closed with 2-0 and 3-0 Vicryl.  Benzoin and Steri-Strips were painted on the skin, pressure dressing was applied, and the patient was returned to her room in satisfactory condition.  COMPLICATIONS:  There were no immediate procedure complications.  RESULTS:  This demonstrates successful removal of previously  implanted dual-chamber pacemaker and insertion of a new dual-chamber device without immediate procedure complication.     Doylene Canning. Ladona Ridgel, MD     GWT/MEDQ  D:  03/30/2011  T:  03/31/2011  Job:  [id number]  cc:   Armanda Magic, M.D.

## 2011-04-05 ENCOUNTER — Encounter: Payer: Self-pay | Admitting: Internal Medicine

## 2011-04-12 ENCOUNTER — Ambulatory Visit (INDEPENDENT_AMBULATORY_CARE_PROVIDER_SITE_OTHER): Payer: MEDICARE | Admitting: *Deleted

## 2011-04-12 DIAGNOSIS — I495 Sick sinus syndrome: Secondary | ICD-10-CM

## 2011-04-12 LAB — PACEMAKER DEVICE OBSERVATION
AL AMPLITUDE: 3.8 mv
AL IMPEDENCE PM: 486 Ohm
AL THRESHOLD: 0.9 V
RV LEAD AMPLITUDE: 21.2 mv
VENTRICULAR PACING PM: 6

## 2011-04-12 NOTE — Progress Notes (Signed)
Wound check-PPM 

## 2012-03-26 ENCOUNTER — Encounter: Payer: Self-pay | Admitting: *Deleted

## 2012-03-26 ENCOUNTER — Telehealth: Payer: Self-pay | Admitting: Internal Medicine

## 2012-03-26 NOTE — Telephone Encounter (Addendum)
Spoke with patient's son and he says that Dr Norris Cross office referred him to [place], we have in our records that Dr Mayford Knife is following her device.  I have assured the patients son that we will get to the bottom of this and call him back

## 2012-03-26 NOTE — Telephone Encounter (Addendum)
Needs a letter stating that Pacemaker requires a monitor in her room that is hooked up through a phone line.  This is medically necessary in order for the patient to be able to transmit data to the doctor as necessary.

## 2012-03-26 NOTE — Telephone Encounter (Signed)
New problem   Pt's son need a letter documenting medical necessity for pacemaker monitor. Please call pt's son about this matter. Need letter on [employer] letter head.

## 2012-03-26 NOTE — Telephone Encounter (Signed)
Spoke with [employer].  The patient is being follow with the for routine cardiology along with PPM checks and is enrolled in [employer].  She will call the son and discuss follow up.

## 2012-06-03 ENCOUNTER — Other Ambulatory Visit: Payer: Self-pay | Admitting: Internal Medicine

## 2012-06-05 ENCOUNTER — Ambulatory Visit
Admission: RE | Admit: 2012-06-05 | Discharge: 2012-06-05 | Disposition: A | Discharge status: Home / Self Care | Payer: MEDICARE | Source: Ambulatory Visit | Attending: Internal Medicine | Admitting: Internal Medicine

## 2012-12-04 ENCOUNTER — Telehealth: Payer: Self-pay | Admitting: *Deleted

## 2012-12-04 NOTE — Telephone Encounter (Signed)
Spoke w/ both of pt's sons: Felicia West. Spoke with Felicia West extensively about troubleshooting his mother's Latitude system. Latitude currently connected through [employer] router---pt does not have any other phone jack.  Gave 800 number to Felicia West who called [facility]. [facility] told Felicia West they will call him back.

## 2012-12-06 ENCOUNTER — Telehealth: Payer: Self-pay | Admitting: *Deleted

## 2012-12-06 NOTE — Telephone Encounter (Signed)
Mathis Dad. He states per his brother, Tonna Corner will send return kit for existing monitor. I assigned a new monitor. Also updated her device serial number in paceart.    Spoke w/ Morris after confirming w/ [facility] tech svcs. Morris will come to office to pick up new monitor on 12/09/12.

## 2012-12-09 ENCOUNTER — Encounter: Payer: MEDICARE | Admitting: *Deleted

## 2012-12-27 IMAGING — CR DG HIP COMPLETE 2+V*R*
3 series · 3 of 3 positions shown · non-contrast
Comparison: None

CLINICAL DATA: Right hip pain post fall

RIGHT HIP - COMPLETE 2+ VIEW

[t pelvis ap]
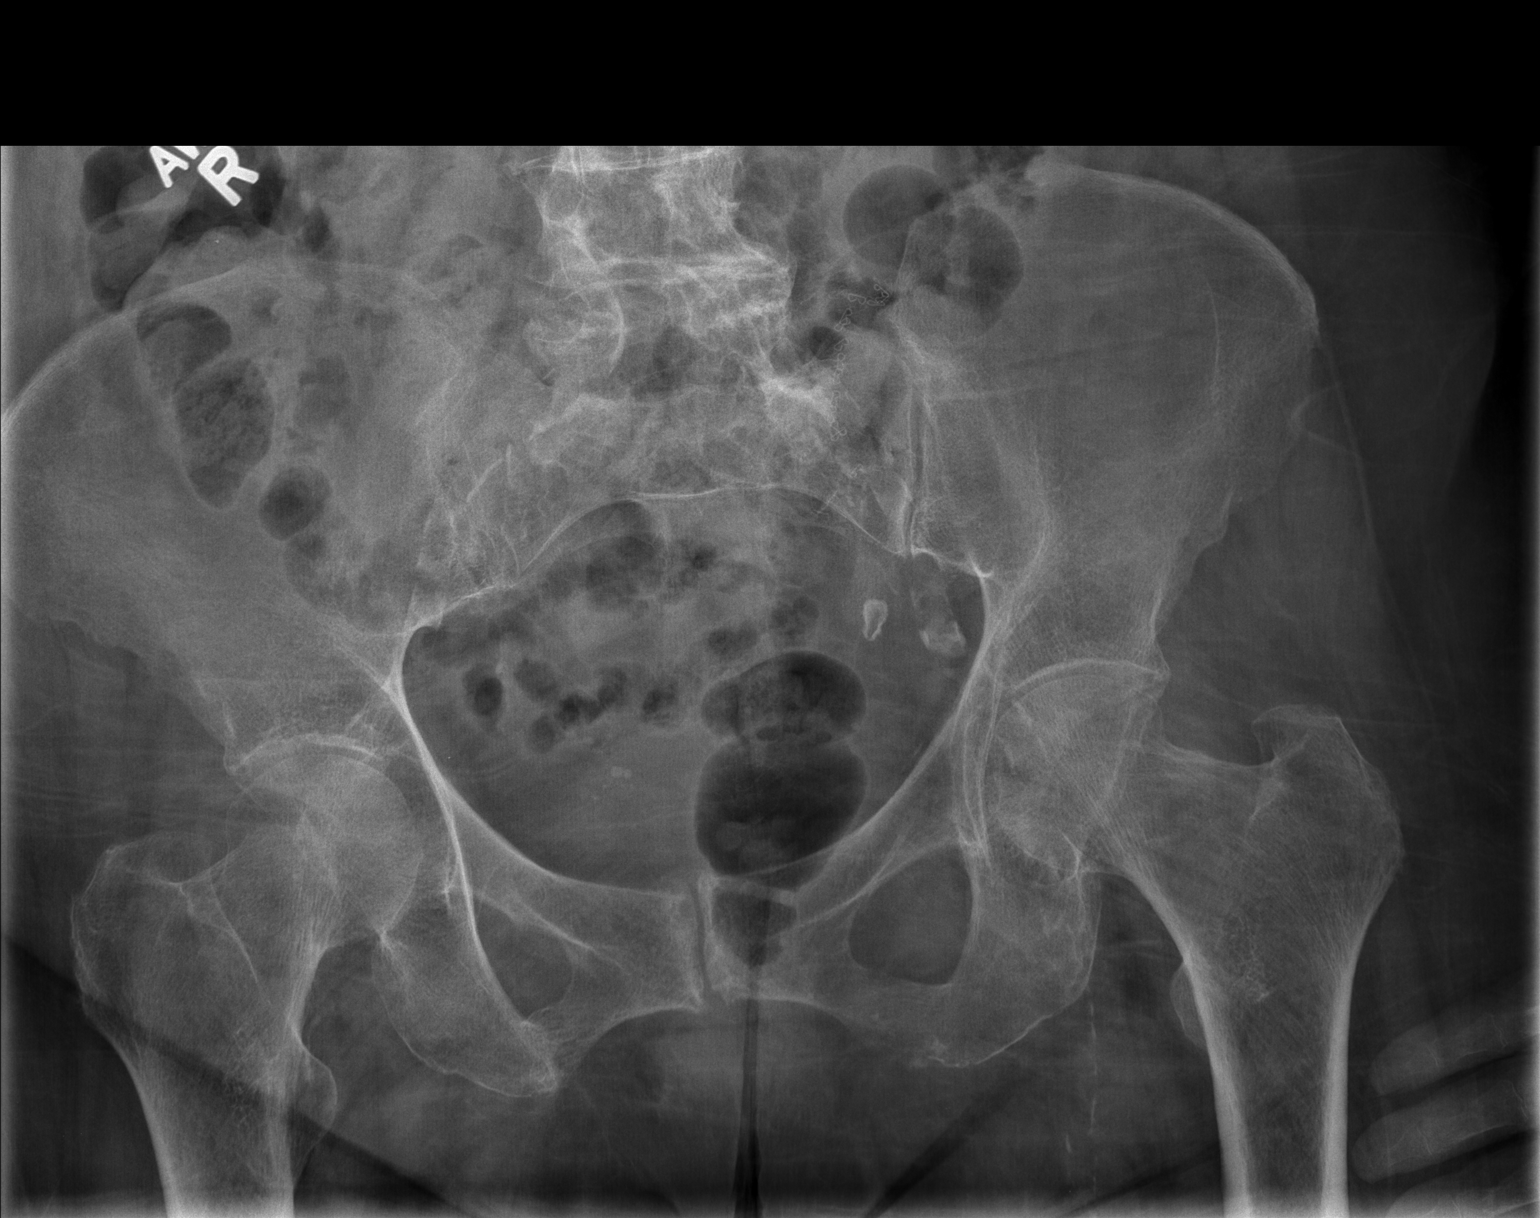

[t hip ap right]
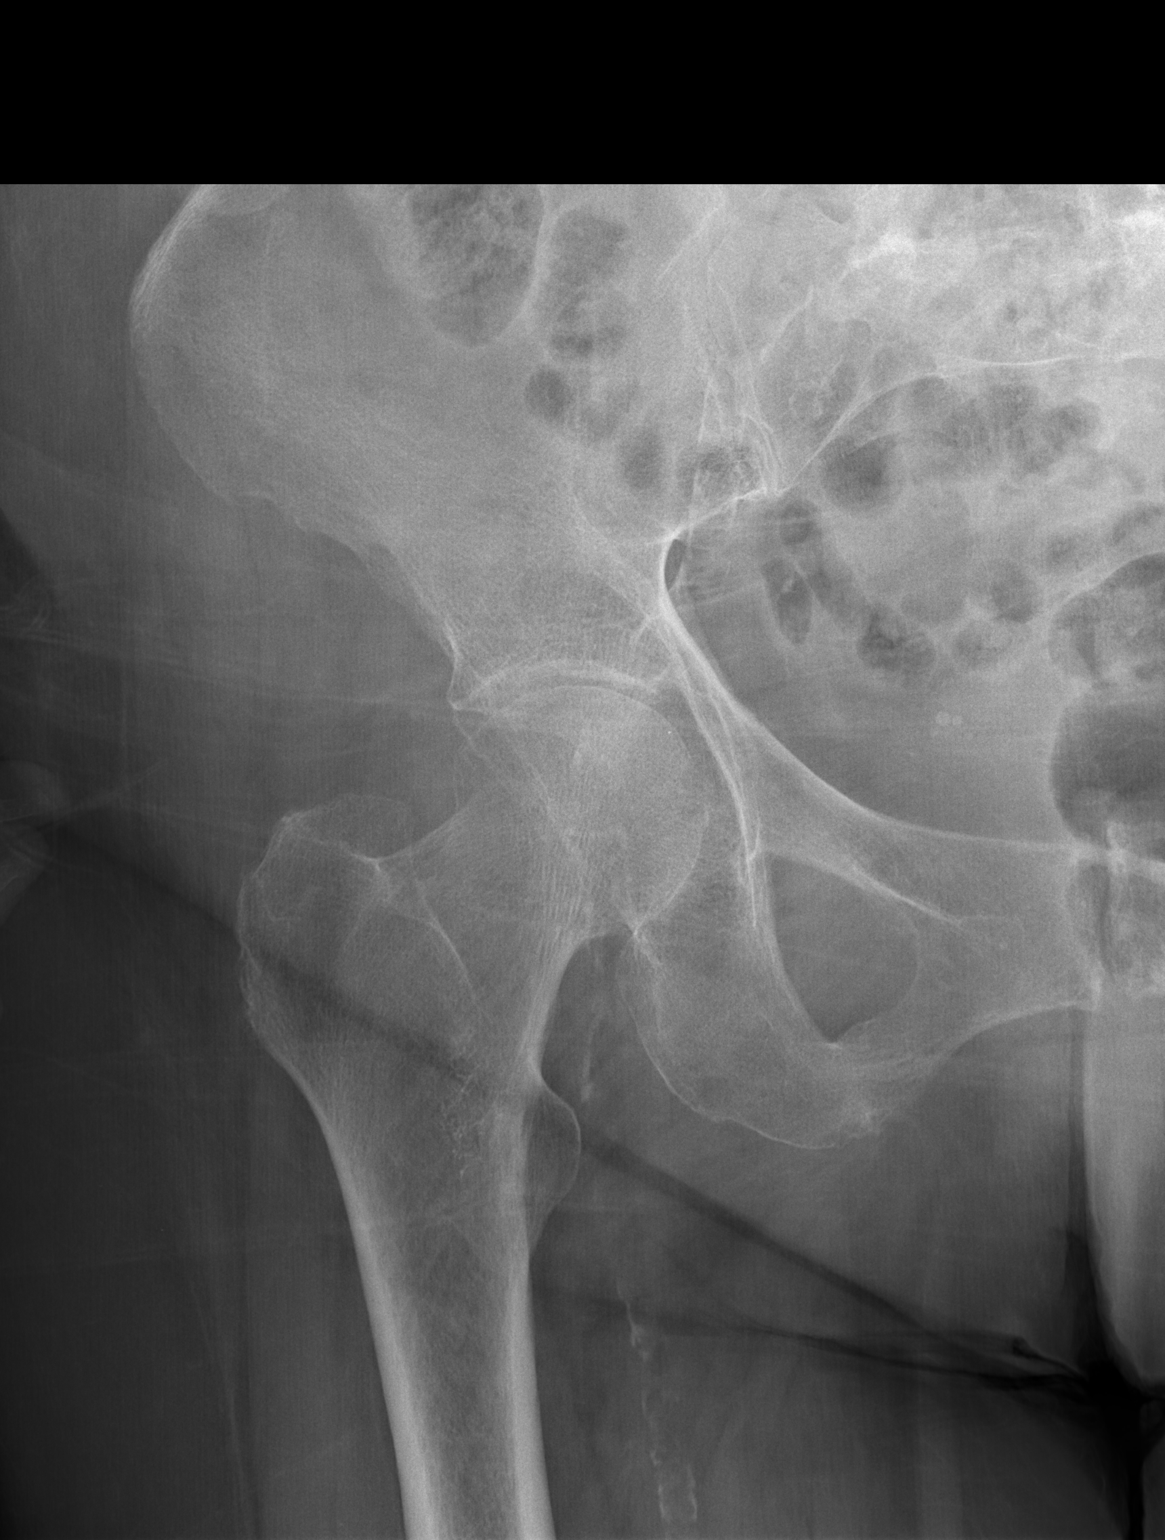

[t hip frog leg right]
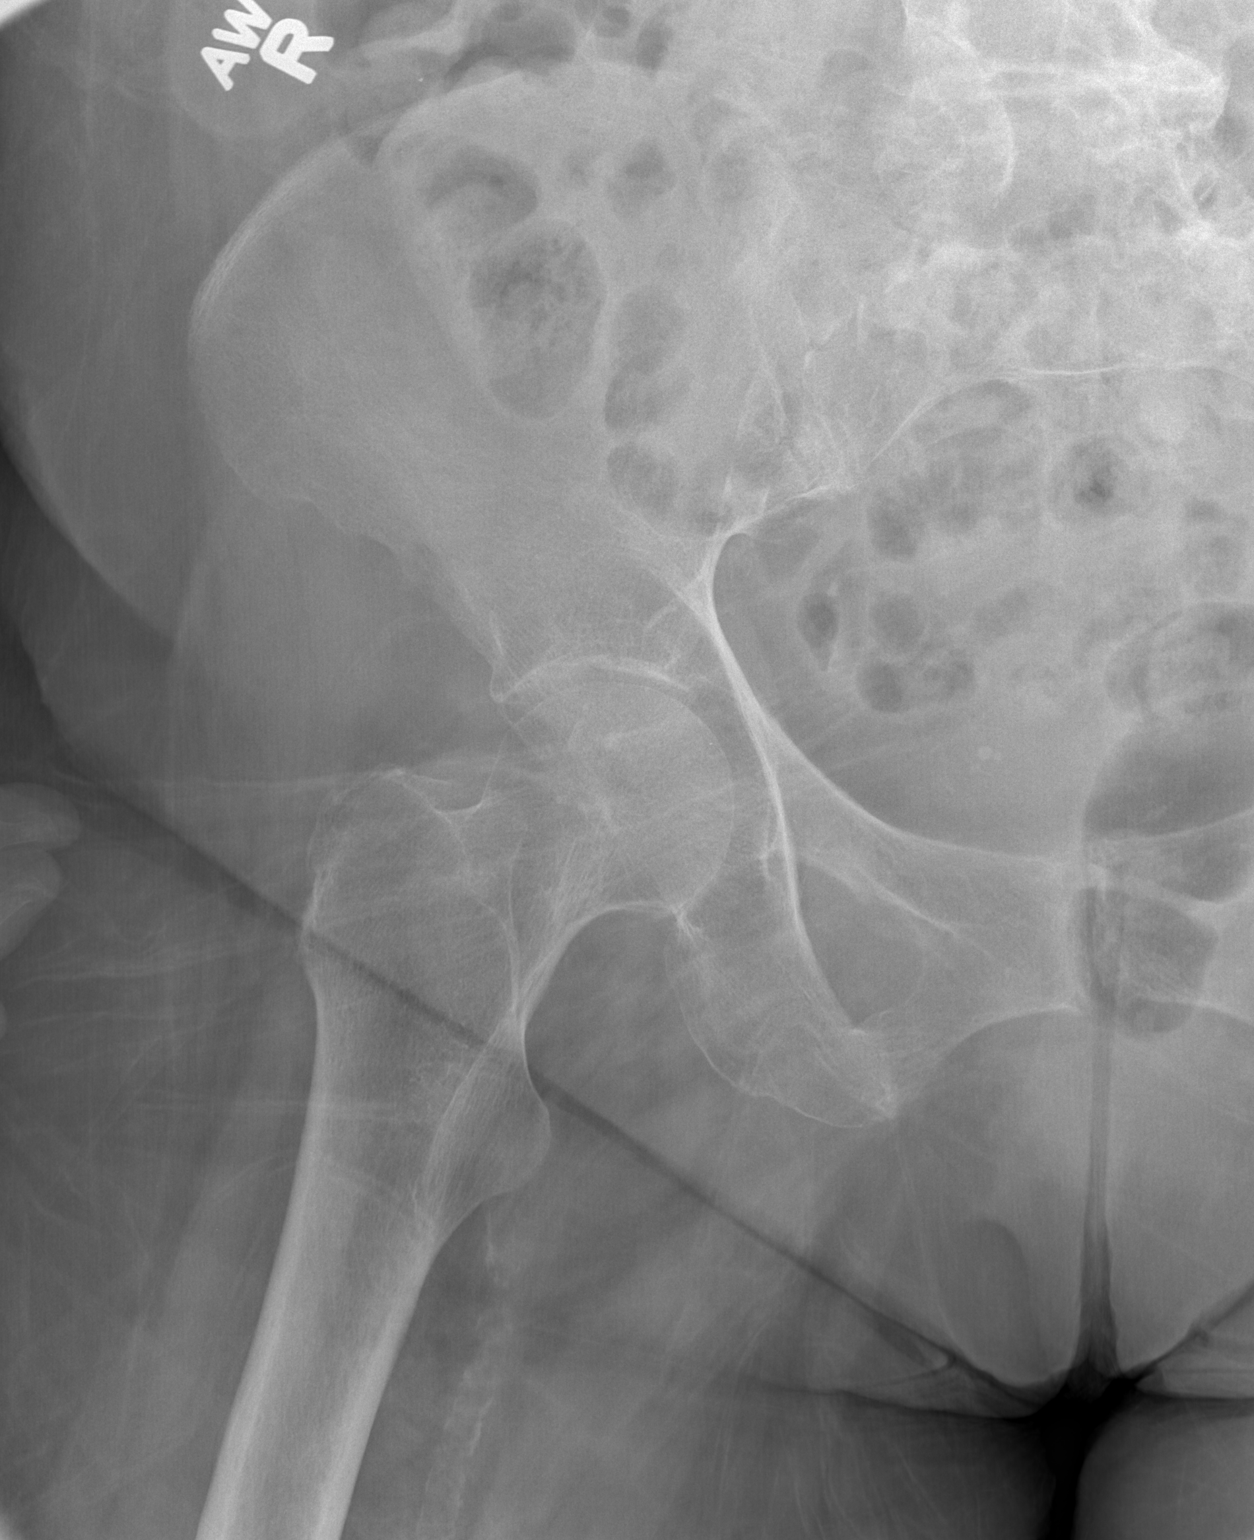

[3 of 3 positions shown; findings below may reference images not displayed]

FINDINGS: Osseous demineralization.
Scattered vascular calcifications.
Mild narrowing of the hip joints bilaterally.
SI joints preserved.
Degenerative changes lower lumbar spine.
No definite acute fracture, dislocation or bone destruction.
Few pelvic phleboliths.
Bedding artifacts.
IMPRESSION: Significant osseous demineralization.
Mild degenerative changes of hip joints bilaterally.
No definite acute bony abnormalities.

## 2012-12-27 IMAGING — CR DG RIBS W/ CHEST 3+V*L*
5 series · 5 of 5 positions shown · non-contrast
Comparison: Chest radiograph 02/11/2010

CLINICAL DATA: Left lower anterior rib pain post fall

LEFT RIBS AND CHEST - 3+ VIEW

[t ribs lpo left (1 of 2)]
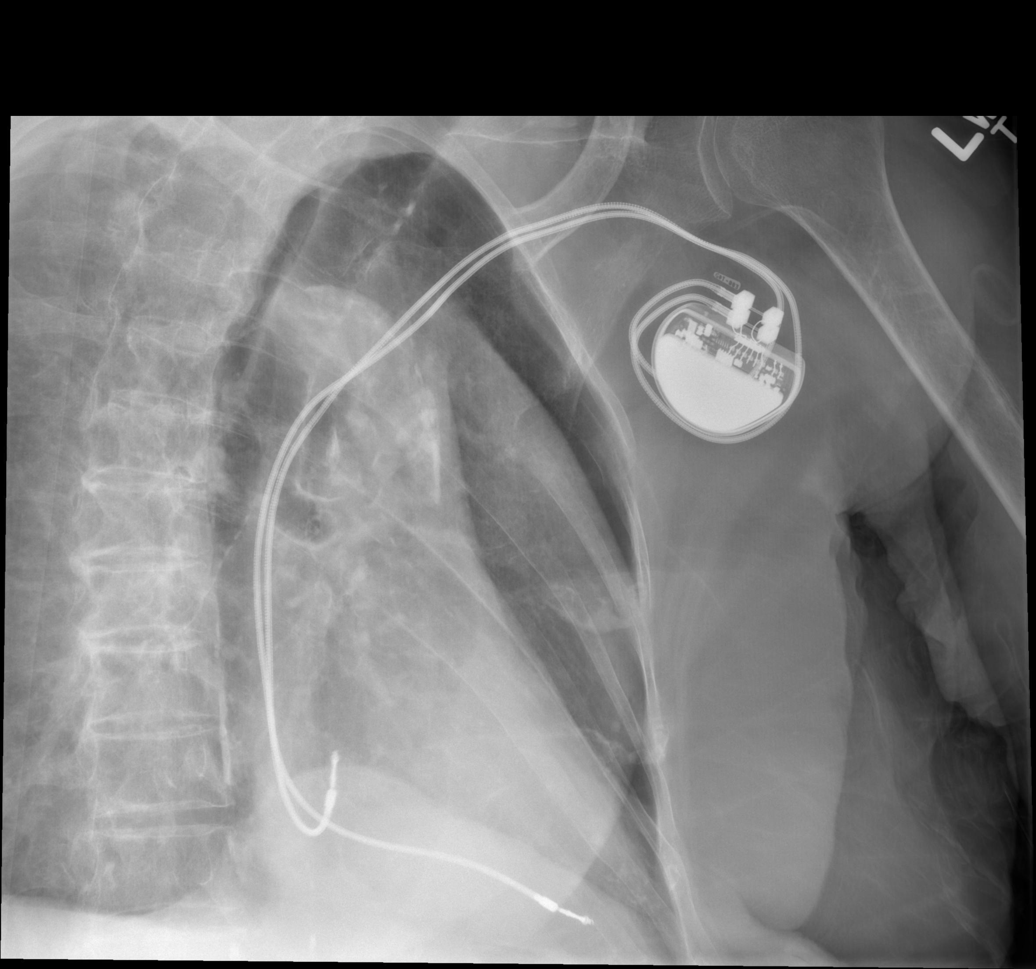

[t ribs lpo left (2 of 2)]
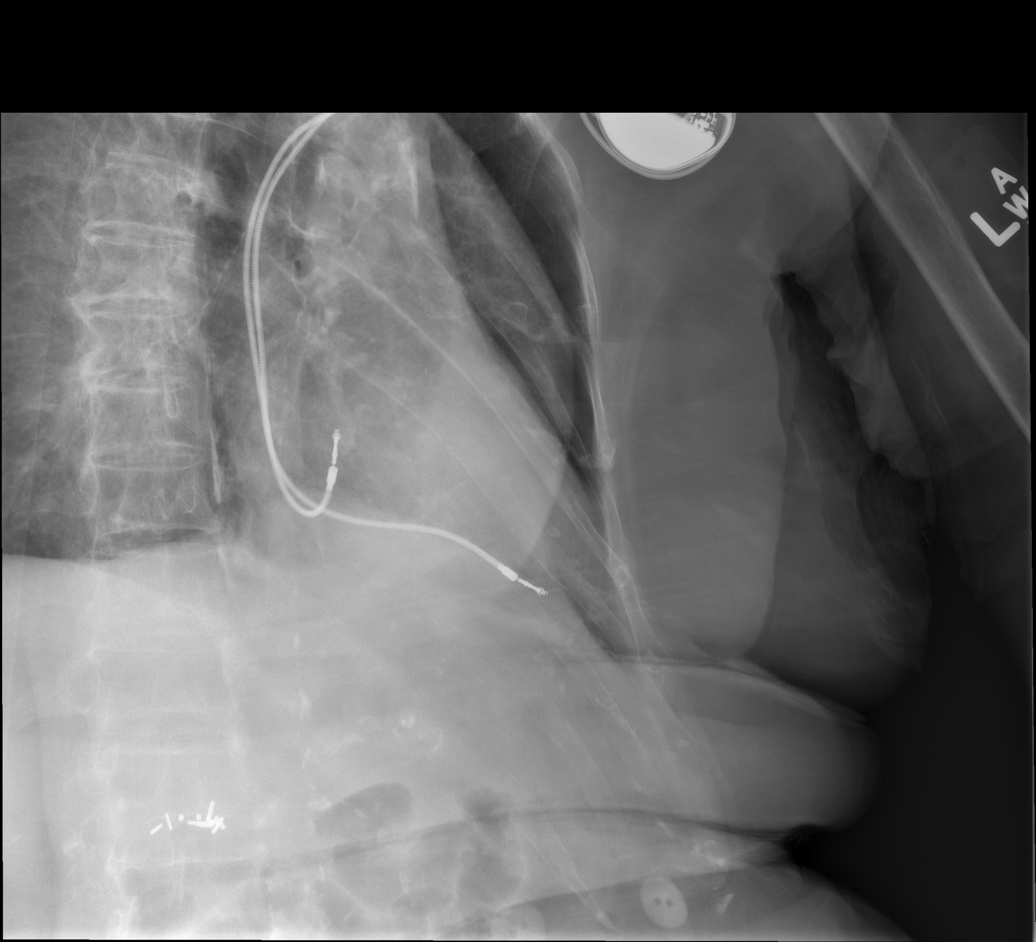

[t ribs ap upper left]
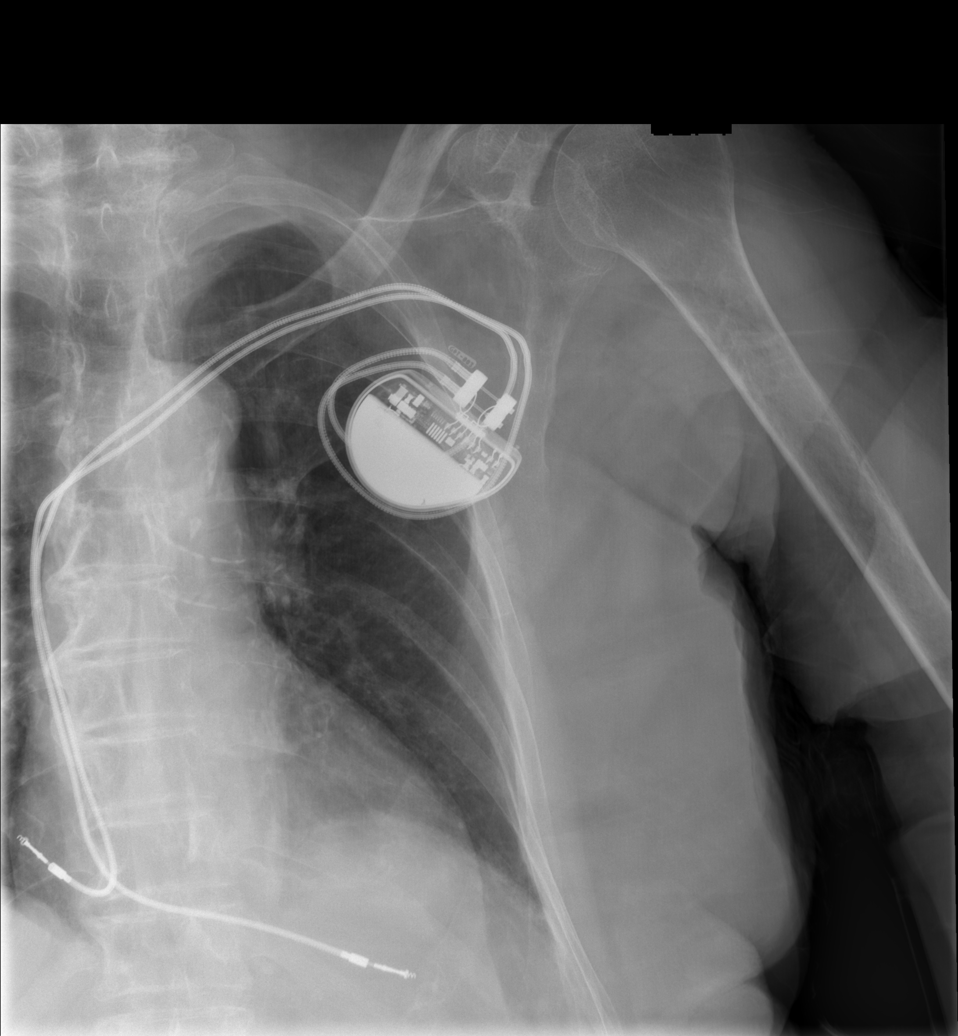

[t ribs ap lower left]
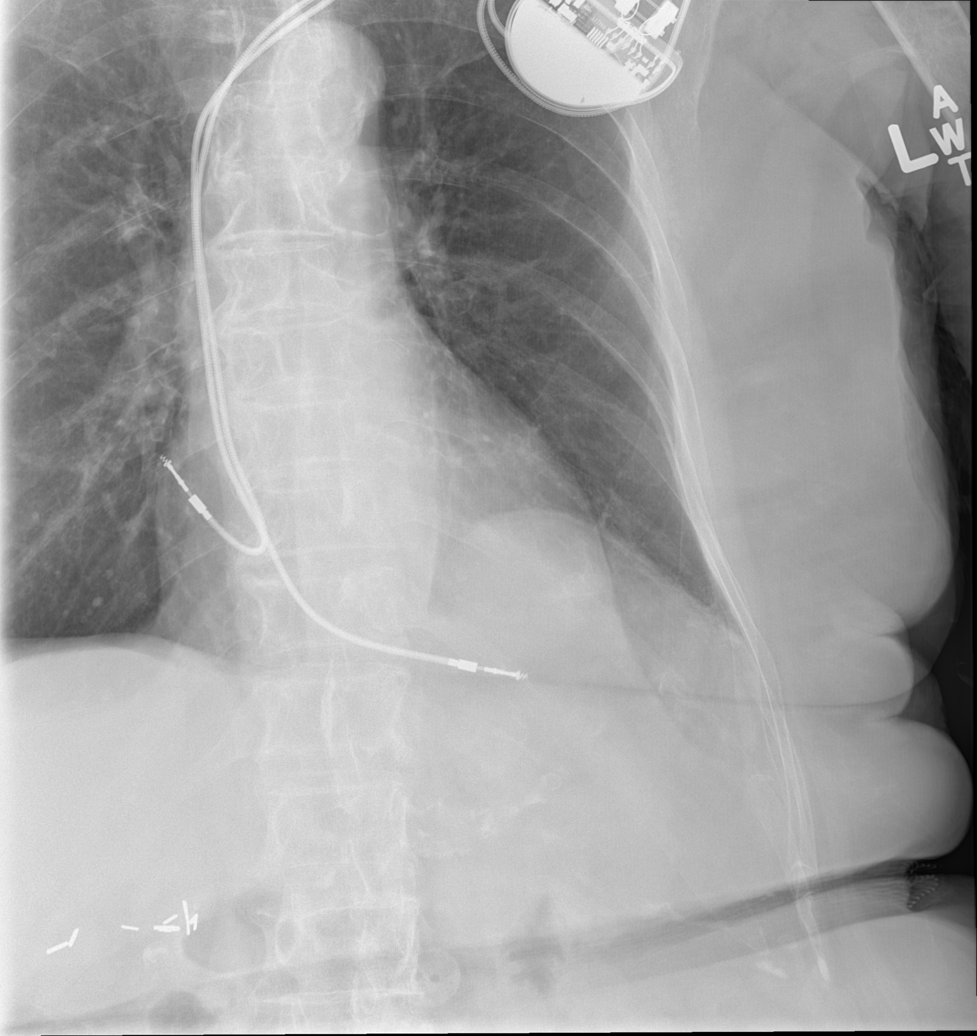

[x chest ap]
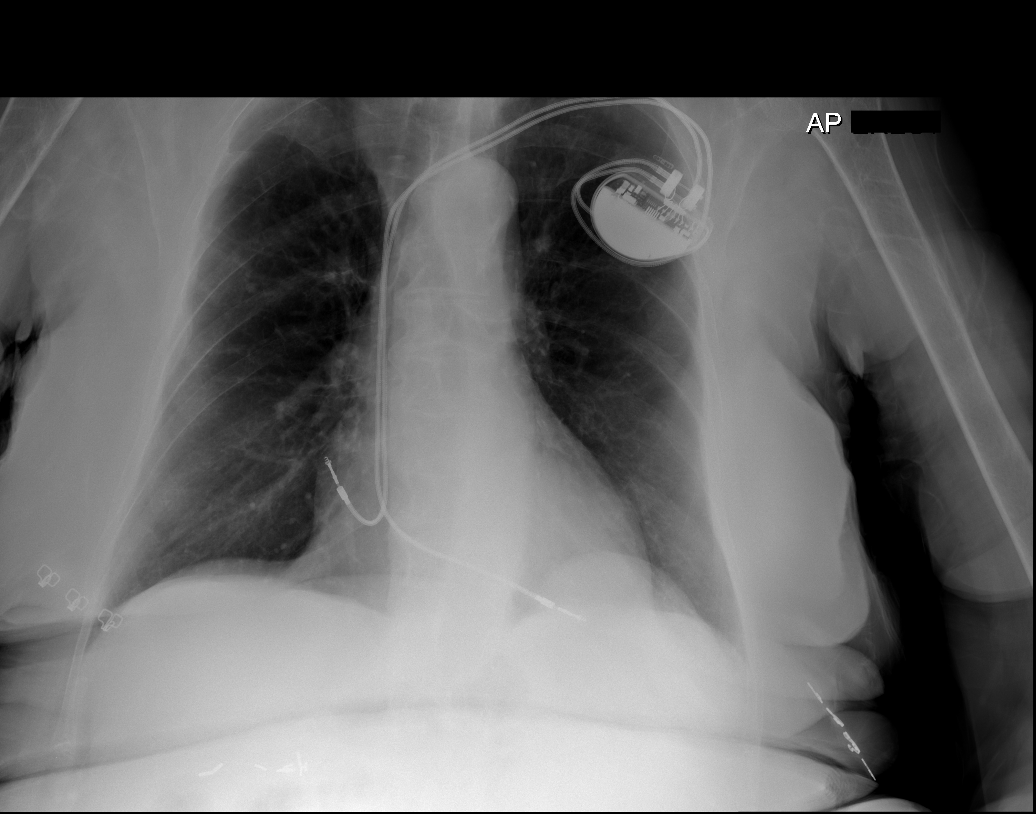

[5 of 5 positions shown; findings below may reference images not displayed]

FINDINGS: Left subclavian sequential transvenous pacemaker leads project at
right atrium and right ventricle.
Enlargement of cardiac silhouette.
Prominent right superior mediastinal soft tissues stable question
related to tortuous innominate artery.
Calcified tortuous aorta.
Lungs slightly hyperinflated but clear.
Eventration left diaphragm stable.
No gross pleural effusion or pneumothorax.
Diffuse osseous demineralization.
Nondisplaced fracture anterolateral left question sixth rib.
IMPRESSION: Enlargement of cardiac silhouette post pacemaker.
Osseous demineralization.
Nondisplaced fracture of left sixth rib.

## 2012-12-27 IMAGING — CT CT HIP*R* W/O CM
1 series · 16 of 32 positions shown, 20 images · non-contrast
Comparison: None.

CLINICAL DATA: Fall

CT OF THE RIGHT HIP WITHOUT CONTRAST
TECHNIQUE: Multidetector CT imaging was performed according to the
standard protocol. Multiplanar CT image reconstructions were also
generated.

[Series 3: hip st · axial · 0.41mm/px · z∈[+1110,+1250]mm · 16 of 32 slices shown, 20 images]
[im 3/32  soft-tissue]
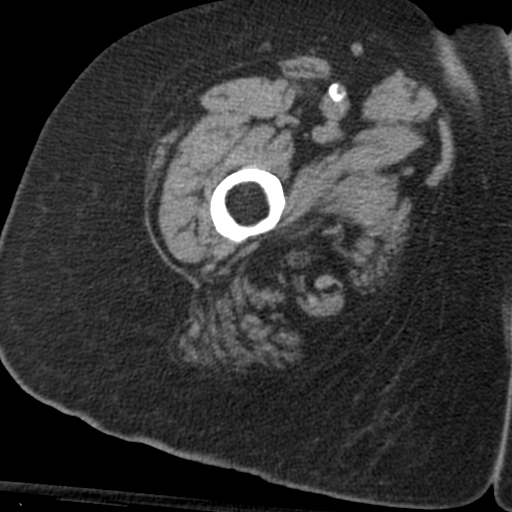
[im 3/32  bone]
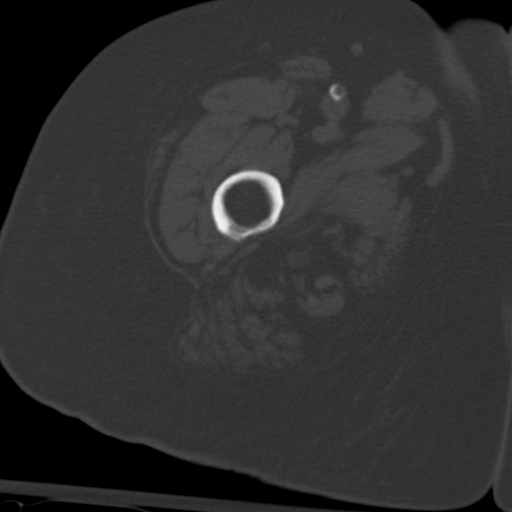
[im 5/32  soft-tissue]
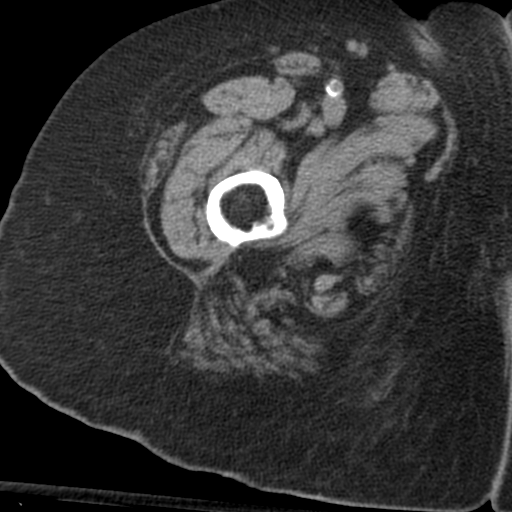
[im 7/32  soft-tissue]
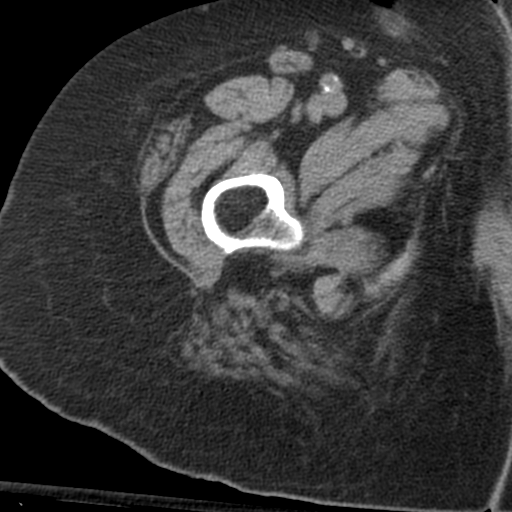
[im 9/32  soft-tissue]
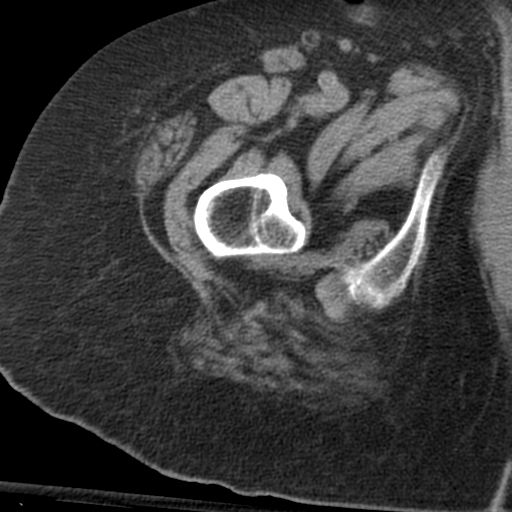
[im 11/32  soft-tissue]
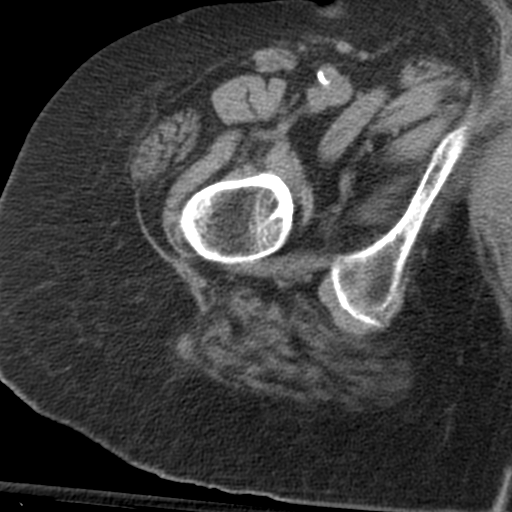
[im 13/32  soft-tissue]
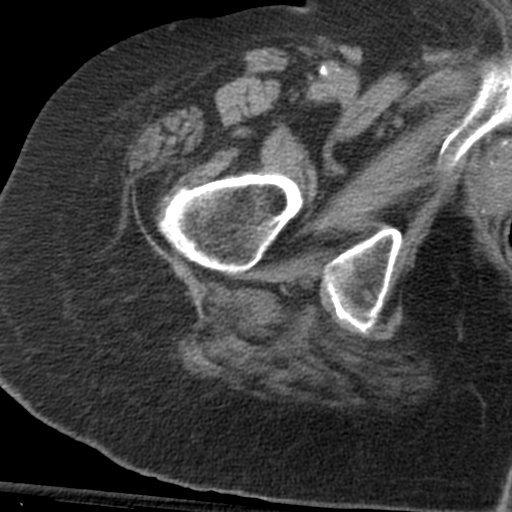
[im 15/32  soft-tissue]
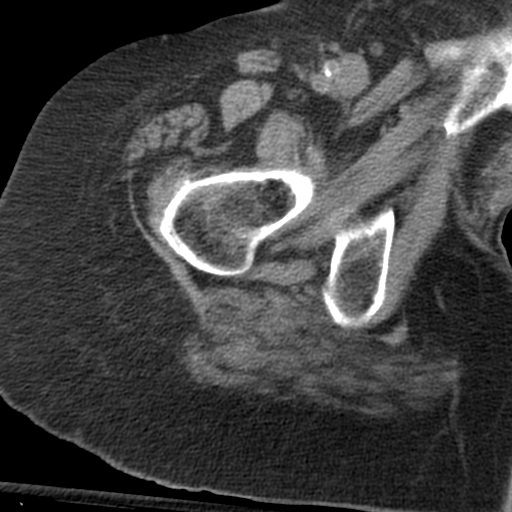
[im 18/32  soft-tissue]
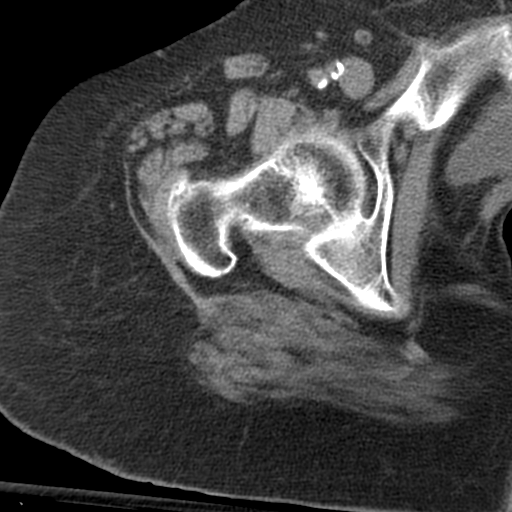
[im 20/32  soft-tissue]
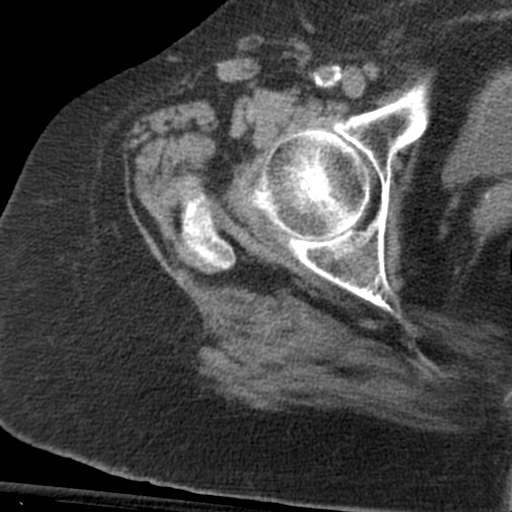
[im 20/32  bone]
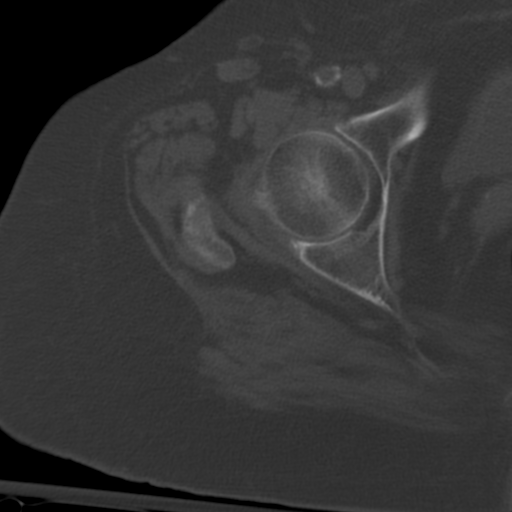
[im 22/32  soft-tissue]
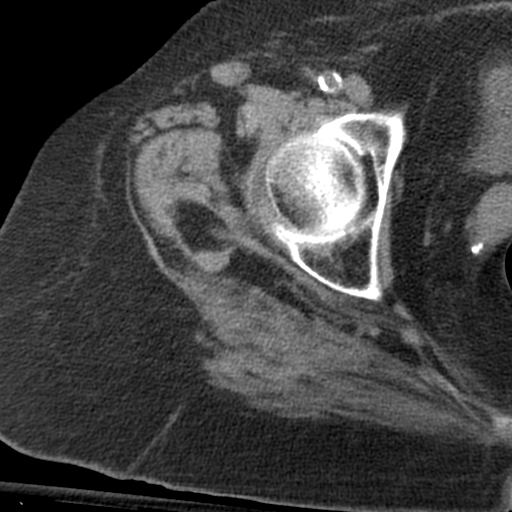
[im 24/32  soft-tissue]
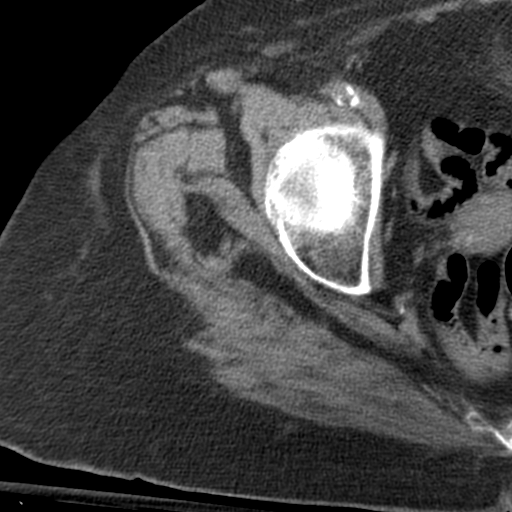
[im 26/32  soft-tissue]
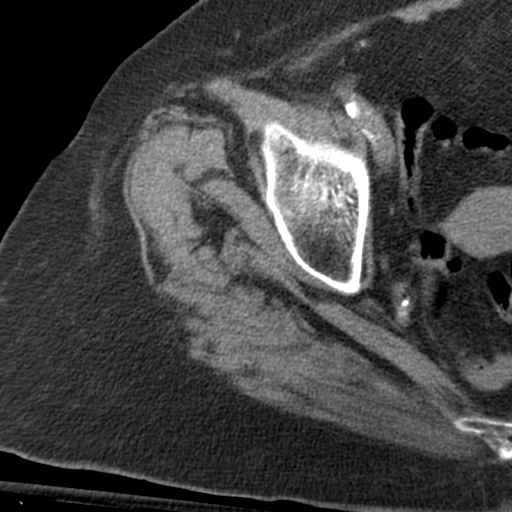
[im 28/32  soft-tissue]
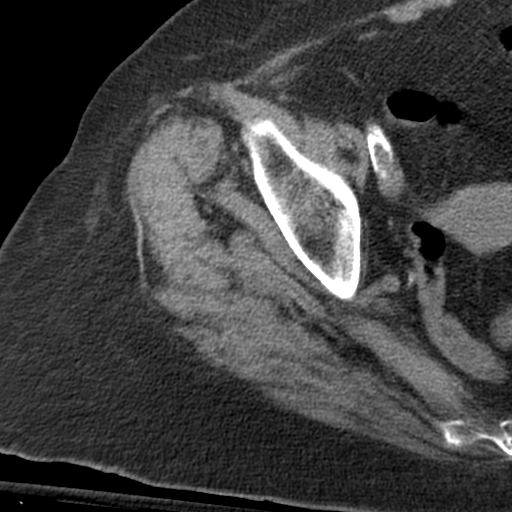
[im 28/32  lung]
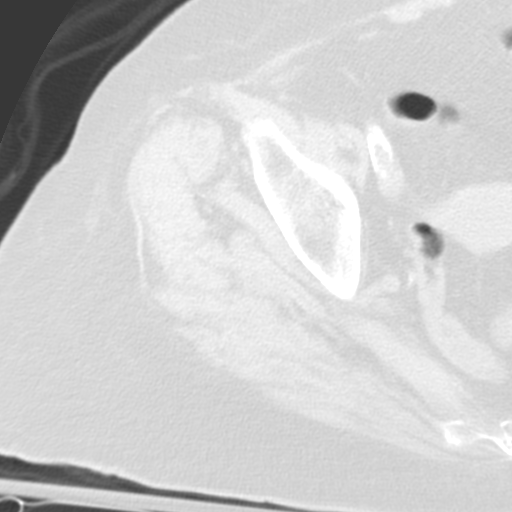
[im 29/32  lung]
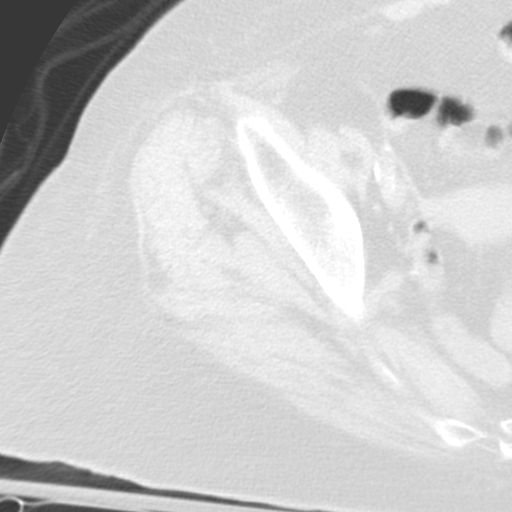
[im 30/32  soft-tissue]
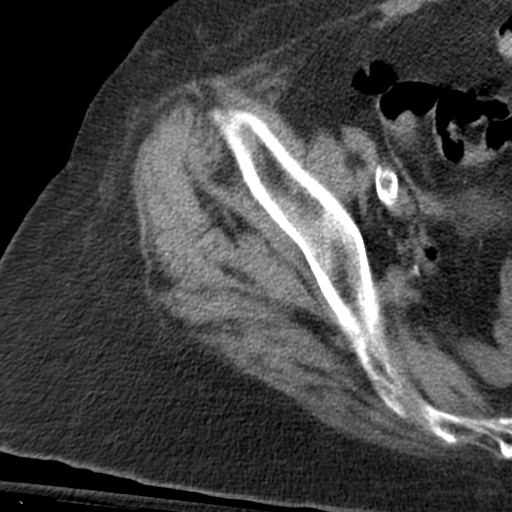
[im 30/32  lung]
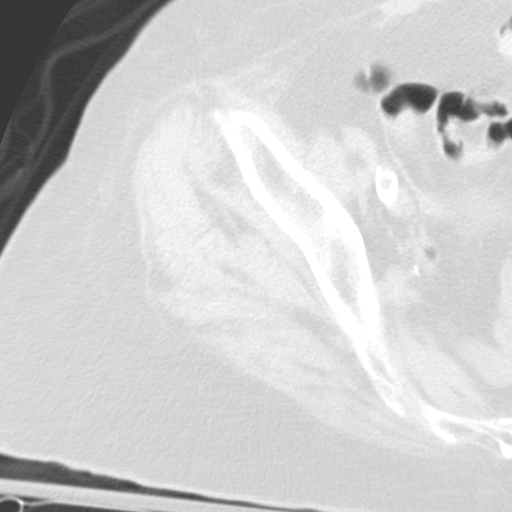
[im 31/32  lung]
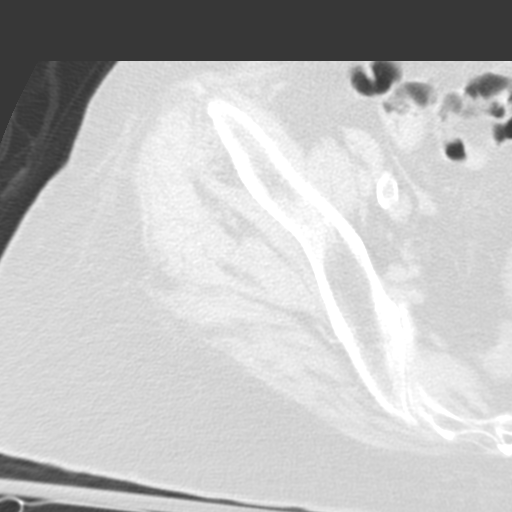

[16 of 32 positions shown; findings below may reference images not displayed]

FINDINGS: No acute fracture.  No dislocation.  Mild degenerative
changes.  Unremarkable soft tissues other than vascular
calcifications. Right greater trochanter spurring.
IMPRESSION: No acute bony injury of the right femoral neck or acetabulum.

## 2013-02-14 ENCOUNTER — Encounter: Payer: Self-pay | Admitting: Internal Medicine

## 2013-03-11 ENCOUNTER — Encounter: Payer: MEDICARE | Admitting: Internal Medicine

## 2013-03-12 ENCOUNTER — Emergency Department (HOSPITAL_COMMUNITY)
Admission: EM | Admit: 2013-03-12 | Discharge: 2013-03-12 | Disposition: A | Discharge status: Home / Self Care | Payer: MEDICARE | Attending: Emergency Medicine | Admitting: Emergency Medicine

## 2013-03-12 ENCOUNTER — Emergency Department (HOSPITAL_COMMUNITY): Payer: MEDICARE

## 2013-03-12 ENCOUNTER — Encounter (HOSPITAL_COMMUNITY): Payer: Self-pay | Admitting: Emergency Medicine

## 2013-03-12 DIAGNOSIS — I495 Sick sinus syndrome: Secondary | ICD-10-CM | POA: Insufficient documentation

## 2013-03-12 DIAGNOSIS — Z950 Presence of cardiac pacemaker: Secondary | ICD-10-CM | POA: Insufficient documentation

## 2013-03-12 DIAGNOSIS — E860 Dehydration: Secondary | ICD-10-CM | POA: Insufficient documentation

## 2013-03-12 DIAGNOSIS — Z9889 Other specified postprocedural states: Secondary | ICD-10-CM | POA: Insufficient documentation

## 2013-03-12 DIAGNOSIS — Z79899 Other long term (current) drug therapy: Secondary | ICD-10-CM | POA: Insufficient documentation

## 2013-03-12 DIAGNOSIS — M810 Age-related osteoporosis without current pathological fracture: Secondary | ICD-10-CM | POA: Insufficient documentation

## 2013-03-12 DIAGNOSIS — N179 Acute kidney failure, unspecified: Secondary | ICD-10-CM | POA: Insufficient documentation

## 2013-03-12 DIAGNOSIS — W19XXXA Unspecified fall, initial encounter: Secondary | ICD-10-CM

## 2013-03-12 DIAGNOSIS — Y9389 Activity, other specified: Secondary | ICD-10-CM | POA: Insufficient documentation

## 2013-03-12 DIAGNOSIS — I10 Essential (primary) hypertension: Secondary | ICD-10-CM | POA: Insufficient documentation

## 2013-03-12 DIAGNOSIS — I252 Old myocardial infarction: Secondary | ICD-10-CM | POA: Insufficient documentation

## 2013-03-12 DIAGNOSIS — I2510 Atherosclerotic heart disease of native coronary artery without angina pectoris: Secondary | ICD-10-CM | POA: Insufficient documentation

## 2013-03-12 DIAGNOSIS — K219 Gastro-esophageal reflux disease without esophagitis: Secondary | ICD-10-CM | POA: Insufficient documentation

## 2013-03-12 DIAGNOSIS — Y9210 Unspecified residential institution as the place of occurrence of the external cause: Secondary | ICD-10-CM | POA: Insufficient documentation

## 2013-03-12 DIAGNOSIS — S02401A Maxillary fracture, unspecified, initial encounter for closed fracture: Secondary | ICD-10-CM | POA: Insufficient documentation

## 2013-03-12 DIAGNOSIS — F0390 Unspecified dementia without behavioral disturbance: Secondary | ICD-10-CM | POA: Insufficient documentation

## 2013-03-12 DIAGNOSIS — S02400A Malar fracture unspecified, initial encounter for closed fracture: Secondary | ICD-10-CM | POA: Insufficient documentation

## 2013-03-12 DIAGNOSIS — R296 Repeated falls: Secondary | ICD-10-CM | POA: Insufficient documentation

## 2013-03-12 DIAGNOSIS — Z7982 Long term (current) use of aspirin: Secondary | ICD-10-CM | POA: Insufficient documentation

## 2013-03-12 DIAGNOSIS — Z880 Allergy status to penicillin: Secondary | ICD-10-CM | POA: Insufficient documentation

## 2013-03-12 LAB — POCT I-STAT TROPONIN I: Troponin i, poc: 0.03 ng/mL (ref 0.00–0.08)

## 2013-03-12 LAB — URINALYSIS, ROUTINE W REFLEX MICROSCOPIC
BILIRUBIN URINE: NEGATIVE
Glucose, UA: NEGATIVE mg/dL
HGB URINE DIPSTICK: NEGATIVE
Ketones, ur: 15 mg/dL — AB
Nitrite: NEGATIVE
PH: 5 (ref 5.0–8.0)
Protein, ur: NEGATIVE mg/dL
SPECIFIC GRAVITY, URINE: 1.019 (ref 1.005–1.030)
Urobilinogen, UA: 0.2 mg/dL (ref 0.0–1.0)

## 2013-03-12 LAB — URINE MICROSCOPIC-ADD ON

## 2013-03-12 LAB — CBC
HEMATOCRIT: 36.3 % (ref 36.0–46.0)
HEMOGLOBIN: 12 g/dL (ref 12.0–15.0)
MCH: 29.6 pg (ref 26.0–34.0)
MCHC: 33.1 g/dL (ref 30.0–36.0)
MCV: 89.6 fL (ref 78.0–100.0)
Platelets: 106 10*3/uL — ABNORMAL LOW (ref 150–400)
RBC: 4.05 MIL/uL (ref 3.87–5.11)
RDW: 14.7 % (ref 11.5–15.5)
WBC: 7.5 10*3/uL (ref 4.0–10.5)

## 2013-03-12 LAB — BASIC METABOLIC PANEL
BUN: 30 mg/dL — AB (ref 6–23)
CHLORIDE: 103 meq/L (ref 96–112)
CO2: 27 meq/L (ref 19–32)
Calcium: 8.9 mg/dL (ref 8.4–10.5)
Creatinine, Ser: 2.03 mg/dL — ABNORMAL HIGH (ref 0.50–1.10)
GFR calc Af Amer: 23 mL/min — ABNORMAL LOW (ref >90)
GFR calc non Af Amer: 20 mL/min — ABNORMAL LOW (ref >90)
GLUCOSE: 106 mg/dL — AB (ref 70–99)
POTASSIUM: 4.6 meq/L (ref 3.7–5.3)
SODIUM: 141 meq/L (ref 137–147)

## 2013-03-12 LAB — GLUCOSE, CAPILLARY: GLUCOSE-CAPILLARY: 98 mg/dL (ref 70–99)

## 2013-03-12 MED ORDER — SODIUM CHLORIDE 0.9 % IV SOLN
1000.0000 mL | Freq: Once | INTRAVENOUS | Status: AC
  Administered 2013-03-12: 1000 mL via INTRAVENOUS

## 2013-03-12 MED ORDER — SODIUM CHLORIDE 0.9 % IV SOLN
1000.0000 mL | INTRAVENOUS | Status: DC
  Administered 2013-03-12: 1000 mL via INTRAVENOUS

## 2013-03-12 NOTE — Discharge Instructions (Signed)
Call Dr. Donette LarryHusain for follow up on your renal function or kidney function. Call Dr. Lazarus SalinesWolicki for further evaluation of your facial fracture.

## 2013-03-12 NOTE — ED Notes (Addendum)
Pt has dementia lives at nursing home ([facility]). Stood up to pull up pants after going to bathroom. Un witnessed fall. Pt had complete LOC and unknown if LOC was prior to fall. Pt hit face, left nostril with blood. No trauma noted. Pt complaining of pain to neck, forehead, neck, shoulder. Right pupil is larger than left. It is unknown if this is normal for pt. BP 116/81. Pt has PPM. CBG 144

## 2013-03-12 NOTE — ED Notes (Signed)
[facility]ed [facility] about [facility] return to facility as well as discharge instructions. [facility] called for transport. Son at bedside discussed plan of care.

## 2013-03-12 NOTE — ED Notes (Signed)
Unable to obtain labs. Pt has been in radiology/CT

## 2013-03-12 NOTE — ED Provider Notes (Signed)
Medical screening examination/treatment/procedure(s) were conducted as a shared visit with non-physician practitioner(s) and myself.  I personally evaluated the patient during the encounter.  EKG Interpretation    Date/Time:  Wednesday March 12 2013 14:54:38 EST Ventricular Rate:  70 PR Interval:  169 QRS Duration: 107 QT Interval:  420 QTC Calculation: 453 R Axis:   -48 Text Interpretation:  Sinus rhythm Left anterior fascicular block Anteroseptal infarct, age indeterminate No significant change was found Confirmed by Druanne Bosques  MD, Ketih Goodie ([id number]) on 03/12/2013 3:01:25 PM            Patient appears to have a posterior maxillary sinus wall fracture.  This nonoperative treatment.  Outpatient ENT followup.  Patient otherwise overall looks well.  She will undergo labs and urinalysis from a mild delirium workup.  Much of her attitude seems to be more often instead anything.  Likely discharge home with close PCP followup  Lyanne CoKevin M Abhiram Criado, MD 03/12/13 [phone]

## 2013-03-12 NOTE — ED Provider Notes (Signed)
CSN: [id number]     Arrival date & time 03/12/13  1409 History   First MD Initiated Contact with Patient 03/12/13 1414     Chief Complaint  Patient presents with  . Fall     (Consider location/radiation/quality/duration/timing/severity/associated sxs/prior Treatment) HPI Comments: Patient is a 80 year old female with a past medical history of dementia, MI, hypertension, sick sinus syndrome, CAD and osteoporosis who presents to the emergency department via EMS with her son from [facility] after a fall witnessed by her son. Son states over the past 2 days patient has been dehydrated, slightly lethargic, they have been pushing fluids. This morning when he went to visit, she was acting completely normal, "anything but lethargic", sitting in a chair, talking, requesting to use her walker to go to the bathroom rather than a wheelchair. When patient got up from the toilet, she went to her pants up, son saw her take a few steps and felt directly onto the front of her face with positive loss of consciousness. Loss of consciousness lasted a few seconds. Son states patient is not acting normal, she usually talks in full conversation and is very alert. Initially patient was also complaining of left shoulder pain. 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 Son states 1 month ago pt was treated for bilateral pneumonia. Patient qualifies as a level V caveat due to altered mental status. DNR.  The history is provided by the EMS personnel.    Past Medical History  Diagnosis Date  . Hypertension   . Hyperlipidemia   . SSS (sick sinus syndrome)   . CAD (coronary artery disease)   . Dementia   . MI (myocardial infarction)   . GERD (gastroesophageal reflux disease)   . Diverticulosis   . Osteoporosis    Past Surgical History  Procedure Laterality  Date  . Knee surgery    . Pacemaker insertion    . Cardiac catheterization  09/09/02  . Vascular surgery    . Cholecystectomy    . Colectomy     Family History  Problem Relation Age of Onset  . Colon cancer Father   . Stroke Brother   . Pneumonia Mother    History  Substance Use Topics  . Smoking status: Never Smoker   . Smokeless tobacco: Never Used  . Alcohol Use: No   OB History   Grav Para Term Preterm Abortions TAB SAB Ect Mult Living                 Review of Systems  Unable to perform ROS: Mental status change      Allergies  Penicillins and Lidocaine  Home Medications   Current Outpatient Rx  Name  Route  Sig  Dispense  Refill  . acetaminophen (TYLENOL) 650 MG CR tablet   Oral   Take 650 mg by mouth every 4 (four) hours as needed for pain (headache').         . aspirin EC 325 MG tablet   Oral   Take 325 mg by mouth daily.          Marland Kitchen escitalopram (LEXAPRO) 10 MG tablet   Oral   Take 10 mg by mouth daily.         . irbesartan (AVAPRO) 150 MG tablet   Oral   Take 150 mg by  mouth daily.         . metoprolol succinate (TOPROL-XL) 25 MG 24 hr tablet   Oral   Take 25 mg by mouth daily.          . mirtazapine (REMERON) 15 MG tablet   Oral   Take 7.5 mg by mouth at bedtime.          . Multiple Vitamin (MULITIVITAMIN WITH MINERALS) TABS   Oral   Take 1 tablet by mouth daily.         . QUEtiapine (SEROQUEL) 25 MG tablet   Oral   Take 25 mg by mouth at bedtime.         . traMADol (ULTRAM) 50 MG tablet   Oral   Take 50 mg by mouth 2 (two) times daily as needed. For pain.         . vitamin E 400 UNIT capsule   Oral   Take 400 Units by mouth daily.          Marland Kitchen. EXPIRED: QUEtiapine (SEROQUEL) 25 MG tablet   Oral   Take 1 tablet (25 mg total) by mouth at bedtime.   30 tablet   0    BP 107/64  Pulse 70  Resp 19  SpO2 97% Physical Exam  Nursing note and vitals reviewed. Constitutional: She appears well-developed. She  appears lethargic. Cervical collar in place.  HENT:  Head: Normocephalic.  Right Ear: No hemotympanum.  Left Ear: No hemotympanum.  Nose: Sinus tenderness present.  TTP over nasal bridge. Small amount of bruising on mandible. Dried blood in left nare.  Eyes: Conjunctivae are normal.  Pupils unequal, R pupil ~512mm smaller, reactive to light. L pupil non-reactive.  Neck: No spinous process tenderness and no muscular tenderness present.  Cardiovascular: Normal rate, regular rhythm, normal heart sounds and intact distal pulses.   Pulmonary/Chest: Effort normal and breath sounds normal.  Abdominal: Soft. Bowel sounds are normal. There is no tenderness.  Musculoskeletal: She exhibits no edema.  TTP of left shoulder. No deformity. L clavicle non-tender.  Neurological: She appears lethargic. GCS eye subscore is 3. GCS verbal subscore is 3. GCS motor subscore is 4.  Skin: Skin is warm and dry.    ED Course  Procedures (including critical care time) Labs Review Labs Reviewed  CBC  BASIC METABOLIC PANEL  URINALYSIS, ROUTINE W REFLEX MICROSCOPIC   Imaging Review Dg Chest 2 View  03/12/2013   CLINICAL DATA:  Recent injury  EXAM: CHEST  2 VIEW  COMPARISON:  03/30/2011  FINDINGS: The cardiac shadow is within normal limits. A pacing device is again seen. Eventration of left hemidiaphragm is again noted. No focal infiltrate or sizable effusion is seen. No acute bony abnormality is noted.  IMPRESSION: No acute abnormality seen.   Electronically Signed   By: Alcide CleverMark  Lukens M.D.   On: 03/12/2013 15:38   Ct Head Wo Contrast  03/12/2013   CLINICAL DATA:  Fall with trauma to the head and neck. Unequal pupils.  EXAM: CT HEAD WITHOUT CONTRAST  CT CERVICAL SPINE WITHOUT CONTRAST  TECHNIQUE: Multidetector CT imaging of the head and cervical spine was performed following the standard protocol without intravenous contrast. Multiplanar CT image reconstructions of the cervical spine were also generated.  COMPARISON:   06/05/2012 and previous  FINDINGS: CT HEAD FINDINGS  Brain shows generalized atrophy. There are mild chronic small-vessel changes within the hemispheric white matter. No sign of acute infarction, mass lesion, hemorrhage, hydrocephalus or extra-axial collection. No skull  fracture. There is some fluid in the right maxillary sinus without of visible facial fracture.  CT CERVICAL SPINE FINDINGS  Alignment is normal. There is ordinary osteoarthritis at the C1-2 articulation. There is ordinary facet degeneration on the left at C2-3, C3-4 and C4-5. No visible disc herniation or central canal stenosis. There is foraminal encroachment by osteophytes on the left at C3-4, C4-5 and C5-6. There is a hemangioma involving the C7 vertebral body without evidence of fracture or extraosseous extension. There are areas of patchy parenchymal density at both upper lungs, not primarily or completely evaluated.  IMPRESSION: No acute or traumatic intracranial finding. Atrophy and chronic small vessel disease.  No acute or traumatic cervical spine finding. Ordinary spondylosis and facet degeneration.   Electronically Signed   By: Paulina Fusi M.D.   On: 03/12/2013 14:46   Ct Cervical Spine Wo Contrast  03/12/2013   CLINICAL DATA:  Fall with trauma to the head and neck. Unequal pupils.  EXAM: CT HEAD WITHOUT CONTRAST  CT CERVICAL SPINE WITHOUT CONTRAST  TECHNIQUE: Multidetector CT imaging of the head and cervical spine was performed following the standard protocol without intravenous contrast. Multiplanar CT image reconstructions of the cervical spine were also generated.  COMPARISON:  06/05/2012 and previous  FINDINGS: CT HEAD FINDINGS  Brain shows generalized atrophy. There are mild chronic small-vessel changes within the hemispheric white matter. No sign of acute infarction, mass lesion, hemorrhage, hydrocephalus or extra-axial collection. No skull fracture. There is some fluid in the right maxillary sinus without of visible facial  fracture.  CT CERVICAL SPINE FINDINGS  Alignment is normal. There is ordinary osteoarthritis at the C1-2 articulation. There is ordinary facet degeneration on the left at C2-3, C3-4 and C4-5. No visible disc herniation or central canal stenosis. There is foraminal encroachment by osteophytes on the left at C3-4, C4-5 and C5-6. There is a hemangioma involving the C7 vertebral body without evidence of fracture or extraosseous extension. There are areas of patchy parenchymal density at both upper lungs, not primarily or completely evaluated.  IMPRESSION: No acute or traumatic intracranial finding. Atrophy and chronic small vessel disease.  No acute or traumatic cervical spine finding. Ordinary spondylosis and facet degeneration.   Electronically Signed   By: Paulina Fusi M.D.   On: 03/12/2013 14:46   Dg Shoulder Left  03/12/2013   CLINICAL DATA:  Fall .  EXAM: LEFT SHOULDER - 2+ VIEW  COMPARISON:  None.  FINDINGS: There is no evidence of fracture or dislocation. Degenerative changes left shoulder . Pacemaker present.  IMPRESSION: No acute abnormality. Degenerative changes left shoulder. Pacemaker noted.   Electronically Signed   By: Maisie Fus  Register   On: 03/12/2013 15:37    EKG Interpretation    Date/Time:  Wednesday March 12 2013 14:54:38 EST Ventricular Rate:  70 PR Interval:  169 QRS Duration: 107 QT Interval:  420 QTC Calculation: 453 R Axis:   -48 Text Interpretation:  Sinus rhythm Left anterior fascicular block Anteroseptal infarct, age indeterminate No significant change was found Confirmed by CAMPOS  MD, KEVIN ([id number]) on 03/12/2013 3:01:25 PM            MDM   Final diagnoses:  None   Pt presenting after witnessed fall, possibly mechanical as she was trying to put her pants back on after using the restroom. She was acting normal per son prior to fall, now lethargic. CT head and c-spine ordered prior to pt being seen, no acute findings. Additional imaging studies pending- CT  maxillofacial, CXR and left shoulder xray. Labs pending- cbc, bmp, ua, troponin. 3:58 PM CXR and shoulder xray without acute findings. CT maxillofacial and labs pending. Pt discussed with Mellody Drown, PA-C at shift change who will take over care of patient at this time.  Case discussed with attending Dr. Patria Mane who agrees with plan of care.   Trevor Mace, PA-C 03/12/13 1558

## 2013-03-21 ENCOUNTER — Ambulatory Visit: Payer: MEDICARE | Admitting: Cardiology

## 2013-03-26 ENCOUNTER — Encounter: Payer: Self-pay | Admitting: Internal Medicine

## 2013-04-11 ENCOUNTER — Encounter: Payer: Self-pay | Admitting: Internal Medicine

## 2013-04-11 ENCOUNTER — Ambulatory Visit (INDEPENDENT_AMBULATORY_CARE_PROVIDER_SITE_OTHER): Payer: MEDICARE

## 2013-04-11 DIAGNOSIS — I495 Sick sinus syndrome: Secondary | ICD-10-CM

## 2013-04-12 LAB — MDC_IDC_ENUM_SESS_TYPE_REMOTE
Battery Remaining Longevity: 84 mo
Brady Statistic RA Percent Paced: 98 %
Brady Statistic RV Percent Paced: 0 %
Lead Channel Impedance Value: 462 Ohm
Lead Channel Sensing Intrinsic Amplitude: 18.5 mV
MDC IDC MSMT LEADCHNL RV IMPEDANCE VALUE: 699 Ohm
MDC IDC PG SERIAL: 126257

## 2013-04-22 ENCOUNTER — Encounter: Payer: Self-pay | Admitting: *Deleted

## 2013-04-22 NOTE — Progress Notes (Signed)
PPM remote 

## 2013-05-14 ENCOUNTER — Encounter (HOSPITAL_COMMUNITY)
Admission: EM | Disposition: A | Discharge status: Skilled Nursing Facility | Payer: Self-pay | Source: Home / Self Care | Attending: Internal Medicine

## 2013-05-14 ENCOUNTER — Encounter (HOSPITAL_COMMUNITY): Payer: Self-pay | Admitting: Emergency Medicine

## 2013-05-14 ENCOUNTER — Emergency Department (HOSPITAL_COMMUNITY): Payer: MEDICARE

## 2013-05-14 ENCOUNTER — Inpatient Hospital Stay (HOSPITAL_COMMUNITY): Payer: MEDICARE | Admitting: Anesthesiology

## 2013-05-14 ENCOUNTER — Inpatient Hospital Stay (HOSPITAL_COMMUNITY)
Admission: EM | Admit: 2013-05-14 | Discharge: 2013-05-17 | DRG: 480 | Disposition: A | Discharge status: Skilled Nursing Facility | Payer: MEDICARE | Attending: Internal Medicine | Admitting: Internal Medicine

## 2013-05-14 ENCOUNTER — Other Ambulatory Visit (HOSPITAL_COMMUNITY): Payer: Self-pay | Admitting: Orthopedic Surgery

## 2013-05-14 ENCOUNTER — Inpatient Hospital Stay (HOSPITAL_COMMUNITY): Payer: MEDICARE

## 2013-05-14 ENCOUNTER — Encounter (HOSPITAL_COMMUNITY): Payer: MEDICARE | Admitting: Anesthesiology

## 2013-05-14 DIAGNOSIS — G9340 Encephalopathy, unspecified: Secondary | ICD-10-CM | POA: Diagnosis present

## 2013-05-14 DIAGNOSIS — I951 Orthostatic hypotension: Secondary | ICD-10-CM

## 2013-05-14 DIAGNOSIS — D72829 Elevated white blood cell count, unspecified: Secondary | ICD-10-CM | POA: Diagnosis present

## 2013-05-14 DIAGNOSIS — E440 Moderate protein-calorie malnutrition: Secondary | ICD-10-CM | POA: Diagnosis not present

## 2013-05-14 DIAGNOSIS — I252 Old myocardial infarction: Secondary | ICD-10-CM

## 2013-05-14 DIAGNOSIS — F0390 Unspecified dementia without behavioral disturbance: Secondary | ICD-10-CM

## 2013-05-14 DIAGNOSIS — N390 Urinary tract infection, site not specified: Secondary | ICD-10-CM | POA: Diagnosis present

## 2013-05-14 DIAGNOSIS — R4020 Unspecified coma: Secondary | ICD-10-CM

## 2013-05-14 DIAGNOSIS — N289 Disorder of kidney and ureter, unspecified: Secondary | ICD-10-CM | POA: Diagnosis present

## 2013-05-14 DIAGNOSIS — I2510 Atherosclerotic heart disease of native coronary artery without angina pectoris: Secondary | ICD-10-CM | POA: Diagnosis present

## 2013-05-14 DIAGNOSIS — I10 Essential (primary) hypertension: Secondary | ICD-10-CM | POA: Diagnosis present

## 2013-05-14 DIAGNOSIS — D649 Anemia, unspecified: Secondary | ICD-10-CM

## 2013-05-14 DIAGNOSIS — S2239XA Fracture of one rib, unspecified side, initial encounter for closed fracture: Secondary | ICD-10-CM

## 2013-05-14 DIAGNOSIS — Z950 Presence of cardiac pacemaker: Secondary | ICD-10-CM

## 2013-05-14 DIAGNOSIS — R339 Retention of urine, unspecified: Secondary | ICD-10-CM | POA: Diagnosis present

## 2013-05-14 DIAGNOSIS — Z79899 Other long term (current) drug therapy: Secondary | ICD-10-CM

## 2013-05-14 DIAGNOSIS — Z7982 Long term (current) use of aspirin: Secondary | ICD-10-CM

## 2013-05-14 DIAGNOSIS — D62 Acute posthemorrhagic anemia: Secondary | ICD-10-CM

## 2013-05-14 DIAGNOSIS — M810 Age-related osteoporosis without current pathological fracture: Secondary | ICD-10-CM | POA: Diagnosis present

## 2013-05-14 DIAGNOSIS — S72009A Fracture of unspecified part of neck of unspecified femur, initial encounter for closed fracture: Principal | ICD-10-CM | POA: Diagnosis present

## 2013-05-14 DIAGNOSIS — IMO0002 Reserved for concepts with insufficient information to code with codable children: Secondary | ICD-10-CM

## 2013-05-14 DIAGNOSIS — K219 Gastro-esophageal reflux disease without esophagitis: Secondary | ICD-10-CM | POA: Diagnosis present

## 2013-05-14 DIAGNOSIS — Z66 Do not resuscitate: Secondary | ICD-10-CM | POA: Diagnosis present

## 2013-05-14 DIAGNOSIS — F4489 Other dissociative and conversion disorders: Secondary | ICD-10-CM

## 2013-05-14 DIAGNOSIS — E785 Hyperlipidemia, unspecified: Secondary | ICD-10-CM

## 2013-05-14 DIAGNOSIS — S72001A Fracture of unspecified part of neck of right femur, initial encounter for closed fracture: Secondary | ICD-10-CM | POA: Diagnosis present

## 2013-05-14 DIAGNOSIS — R627 Adult failure to thrive: Secondary | ICD-10-CM

## 2013-05-14 DIAGNOSIS — I495 Sick sinus syndrome: Secondary | ICD-10-CM | POA: Diagnosis not present

## 2013-05-14 DIAGNOSIS — W19XXXA Unspecified fall, initial encounter: Secondary | ICD-10-CM | POA: Diagnosis present

## 2013-05-14 HISTORY — PX: INTRAMEDULLARY (IM) NAIL INTERTROCHANTERIC: SHX5875

## 2013-05-14 LAB — URINALYSIS, ROUTINE W REFLEX MICROSCOPIC
BILIRUBIN URINE: NEGATIVE
Glucose, UA: NEGATIVE mg/dL
Hgb urine dipstick: NEGATIVE
Ketones, ur: NEGATIVE mg/dL
NITRITE: POSITIVE — AB
PH: 5.5 (ref 5.0–8.0)
Protein, ur: NEGATIVE mg/dL
SPECIFIC GRAVITY, URINE: 1.008 (ref 1.005–1.030)
Urobilinogen, UA: 0.2 mg/dL (ref 0.0–1.0)

## 2013-05-14 LAB — COMPREHENSIVE METABOLIC PANEL
ALT: 9 U/L (ref 0–35)
AST: 21 U/L (ref 0–37)
Albumin: 2.2 g/dL — ABNORMAL LOW (ref 3.5–5.2)
Alkaline Phosphatase: 73 U/L (ref 39–117)
BILIRUBIN TOTAL: 0.4 mg/dL (ref 0.3–1.2)
BUN: 19 mg/dL (ref 6–23)
CALCIUM: 8.3 mg/dL — AB (ref 8.4–10.5)
CHLORIDE: 103 meq/L (ref 96–112)
CO2: 27 mEq/L (ref 19–32)
Creatinine, Ser: 1.3 mg/dL — ABNORMAL HIGH (ref 0.50–1.10)
GFR calc Af Amer: 39 mL/min — ABNORMAL LOW (ref >90)
GFR calc non Af Amer: 34 mL/min — ABNORMAL LOW (ref >90)
Glucose, Bld: 141 mg/dL — ABNORMAL HIGH (ref 70–99)
Potassium: 4.7 mEq/L (ref 3.7–5.3)
Sodium: 138 mEq/L (ref 137–147)
Total Protein: 5 g/dL — ABNORMAL LOW (ref 6.0–8.3)

## 2013-05-14 LAB — CBC WITH DIFFERENTIAL/PLATELET
Basophils Absolute: 0 10*3/uL (ref 0.0–0.1)
Basophils Absolute: 0 10*3/uL (ref 0.0–0.1)
Basophils Relative: 0 % (ref 0–1)
Basophils Relative: 0 % (ref 0–1)
EOS ABS: 0.2 10*3/uL (ref 0.0–0.7)
EOS ABS: 0 10*3/uL (ref 0.0–0.7)
EOS PCT: 2 % (ref 0–5)
EOS PCT: 0 % (ref 0–5)
HCT: 32.2 % — ABNORMAL LOW (ref 36.0–46.0)
HEMATOCRIT: 27.9 % — AB (ref 36.0–46.0)
HEMOGLOBIN: 8.9 g/dL — AB (ref 12.0–15.0)
Hemoglobin: 10.3 g/dL — ABNORMAL LOW (ref 12.0–15.0)
LYMPHS ABS: 0.9 10*3/uL (ref 0.7–4.0)
Lymphocytes Relative: 19 % (ref 12–46)
Lymphocytes Relative: 7 % — ABNORMAL LOW (ref 12–46)
Lymphs Abs: 2.3 10*3/uL (ref 0.7–4.0)
MCH: 29.3 pg (ref 26.0–34.0)
MCH: 29.3 pg (ref 26.0–34.0)
MCHC: 32 g/dL (ref 30.0–36.0)
MCHC: 31.9 g/dL (ref 30.0–36.0)
MCV: 91.7 fL (ref 78.0–100.0)
MCV: 91.8 fL (ref 78.0–100.0)
MONOS PCT: 5 % (ref 3–12)
Monocytes Absolute: 0.8 10*3/uL (ref 0.1–1.0)
Monocytes Absolute: 0.6 10*3/uL (ref 0.1–1.0)
Monocytes Relative: 7 % (ref 3–12)
Neutro Abs: 8.7 10*3/uL — ABNORMAL HIGH (ref 1.7–7.7)
Neutro Abs: 10.9 10*3/uL — ABNORMAL HIGH (ref 1.7–7.7)
Neutrophils Relative %: 72 % (ref 43–77)
Neutrophils Relative %: 88 % — ABNORMAL HIGH (ref 43–77)
PLATELETS: 155 10*3/uL (ref 150–400)
Platelets: 138 10*3/uL — ABNORMAL LOW (ref 150–400)
RBC: 3.51 MIL/uL — AB (ref 3.87–5.11)
RBC: 3.04 MIL/uL — AB (ref 3.87–5.11)
RDW: 15.2 % (ref 11.5–15.5)
RDW: 15.3 % (ref 11.5–15.5)
WBC: 12.1 10*3/uL — ABNORMAL HIGH (ref 4.0–10.5)
WBC: 12.4 10*3/uL — ABNORMAL HIGH (ref 4.0–10.5)

## 2013-05-14 LAB — TYPE AND SCREEN
ABO/RH(D): A POS
Antibody Screen: NEGATIVE

## 2013-05-14 LAB — VITAMIN B12: Vitamin B-12: 493 pg/mL (ref 211–911)

## 2013-05-14 LAB — BASIC METABOLIC PANEL
BUN: 18 mg/dL (ref 6–23)
CALCIUM: 8.6 mg/dL (ref 8.4–10.5)
CO2: 26 meq/L (ref 19–32)
Chloride: 98 mEq/L (ref 96–112)
Creatinine, Ser: 1.23 mg/dL — ABNORMAL HIGH (ref 0.50–1.10)
GFR calc Af Amer: 42 mL/min — ABNORMAL LOW (ref >90)
GFR calc non Af Amer: 36 mL/min — ABNORMAL LOW (ref >90)
GLUCOSE: 127 mg/dL — AB (ref 70–99)
Potassium: 4 mEq/L (ref 3.7–5.3)
Sodium: 137 mEq/L (ref 137–147)

## 2013-05-14 LAB — GLUCOSE, CAPILLARY: GLUCOSE-CAPILLARY: 122 mg/dL — AB (ref 70–99)

## 2013-05-14 LAB — PROTIME-INR
INR: 1.01 (ref 0.00–1.49)
PROTHROMBIN TIME: 13.1 s (ref 11.6–15.2)

## 2013-05-14 LAB — MAGNESIUM: MAGNESIUM: 1.9 mg/dL (ref 1.5–2.5)

## 2013-05-14 LAB — IRON AND TIBC
Iron: 34 ug/dL — ABNORMAL LOW (ref 42–135)
SATURATION RATIOS: 18 % — AB (ref 20–55)
TIBC: 186 ug/dL — AB (ref 250–470)
UIBC: 152 ug/dL (ref 125–400)

## 2013-05-14 LAB — URINE MICROSCOPIC-ADD ON

## 2013-05-14 LAB — ABO/RH
ABO/RH(D): A POS
ABO/RH(D): A POS

## 2013-05-14 LAB — TROPONIN I

## 2013-05-14 LAB — PREPARE RBC (CROSSMATCH)

## 2013-05-14 LAB — HEMOGLOBIN AND HEMATOCRIT, BLOOD
HEMATOCRIT: 20.7 % — AB (ref 36.0–46.0)
HEMOGLOBIN: 6.6 g/dL — AB (ref 12.0–15.0)

## 2013-05-14 LAB — FOLATE: Folate: >20 ng/mL

## 2013-05-14 LAB — FERRITIN: FERRITIN: 127 ng/mL (ref 10–291)

## 2013-05-14 SURGERY — Surgical Case
Anesthesia: *Unknown

## 2013-05-14 SURGERY — FIXATION, FRACTURE, INTERTROCHANTERIC, WITH INTRAMEDULLARY ROD
Anesthesia: General | Site: Hip | Laterality: Right

## 2013-05-14 SURGERY — FIXATION, FRACTURE, INTERTROCHANTERIC, WITH INTRAMEDULLARY ROD
Anesthesia: General

## 2013-05-14 MED ORDER — PROPOFOL 10 MG/ML IV BOLUS
INTRAVENOUS | Status: AC
  Filled 2013-05-14: qty 20

## 2013-05-14 MED ORDER — ACETAMINOPHEN 650 MG RE SUPP
650.0000 mg | Freq: Four times a day (QID) | RECTAL | Status: DC | PRN
  Administered 2013-05-16 (×2): 650 mg via RECTAL
  Filled 2013-05-14: qty 1

## 2013-05-14 MED ORDER — MORPHINE SULFATE 2 MG/ML IJ SOLN
0.5000 mg | INTRAMUSCULAR | Status: DC | PRN
Start: 2013-05-14 — End: 2013-05-15

## 2013-05-14 MED ORDER — METOCLOPRAMIDE HCL 5 MG/ML IJ SOLN: 5.0000 mg | Freq: Three times a day (TID) | INTRAMUSCULAR | Status: DC | PRN

## 2013-05-14 MED ORDER — HYDROCODONE-ACETAMINOPHEN 5-325 MG PO TABS: 1.0000 | ORAL_TABLET | Freq: Four times a day (QID) | ORAL | Status: DC | PRN

## 2013-05-14 MED ORDER — ALBUMIN HUMAN 5 % IV SOLN
12.5000 g | Freq: Once | INTRAVENOUS | Status: AC
  Administered 2013-05-14: 12.5 g via INTRAVENOUS

## 2013-05-14 MED ORDER — LACTATED RINGERS IV SOLN: INTRAVENOUS | Status: DC

## 2013-05-14 MED ORDER — PHENOL 1.4 % MT LIQD: 1.0000 | OROMUCOSAL | Status: DC | PRN

## 2013-05-14 MED ORDER — ACETAMINOPHEN 10 MG/ML IV SOLN
INTRAVENOUS | Status: DC | PRN
  Administered 2013-05-14: 1000 mg via INTRAVENOUS

## 2013-05-14 MED ORDER — FENTANYL CITRATE 0.05 MG/ML IJ SOLN
50.0000 ug | INTRAMUSCULAR | Status: DC | PRN
  Administered 2013-05-14: 50 ug via INTRAVENOUS
  Filled 2013-05-14: qty 2

## 2013-05-14 MED ORDER — CHLORHEXIDINE GLUCONATE 4 % EX LIQD
60.0000 mL | Freq: Once | CUTANEOUS | Status: DC
  Filled 2013-05-14 (×2): qty 60

## 2013-05-14 MED ORDER — ONDANSETRON HCL 4 MG/2ML IJ SOLN: 4.0000 mg | Freq: Four times a day (QID) | INTRAMUSCULAR | Status: DC | PRN

## 2013-05-14 MED ORDER — HYDROCODONE-ACETAMINOPHEN 5-325 MG PO TABS
1.0000 | ORAL_TABLET | Freq: Four times a day (QID) | ORAL | Status: DC | PRN
  Administered 2013-05-15 – 2013-05-16 (×2): 1 via ORAL
  Filled 2013-05-14 (×2): qty 1

## 2013-05-14 MED ORDER — GLYCOPYRROLATE 0.2 MG/ML IJ SOLN
INTRAMUSCULAR | Status: DC | PRN
  Administered 2013-05-14: 0.6 mg via INTRAVENOUS

## 2013-05-14 MED ORDER — METOCLOPRAMIDE HCL 10 MG PO TABS: 5.0000 mg | ORAL_TABLET | Freq: Three times a day (TID) | ORAL | Status: DC | PRN

## 2013-05-14 MED ORDER — ESCITALOPRAM OXALATE 20 MG PO TABS
20.0000 mg | ORAL_TABLET | Freq: Every day | ORAL | Status: DC
  Administered 2013-05-16 – 2013-05-17 (×2): 20 mg via ORAL
  Filled 2013-05-14 (×4): qty 1

## 2013-05-14 MED ORDER — ASPIRIN EC 325 MG PO TBEC
325.0000 mg | DELAYED_RELEASE_TABLET | Freq: Every day | ORAL | Status: DC
Start: 2013-05-15 — End: 2013-05-17
  Administered 2013-05-15 – 2013-05-17 (×3): 325 mg via ORAL
  Filled 2013-05-14 (×4): qty 1

## 2013-05-14 MED ORDER — ACETAMINOPHEN 325 MG PO TABS
650.0000 mg | ORAL_TABLET | Freq: Four times a day (QID) | ORAL | Status: DC | PRN
  Administered 2013-05-15 – 2013-05-17 (×3): 650 mg via ORAL
  Filled 2013-05-14 (×5): qty 2

## 2013-05-14 MED ORDER — QUETIAPINE FUMARATE 25 MG PO TABS
25.0000 mg | ORAL_TABLET | Freq: Every day | ORAL | Status: DC
  Administered 2013-05-15: 25 mg via ORAL
  Filled 2013-05-14 (×3): qty 1

## 2013-05-14 MED ORDER — FENTANYL CITRATE 0.05 MG/ML IJ SOLN
INTRAMUSCULAR | Status: AC
  Administered 2013-05-14: 25 ug via INTRAVENOUS
  Filled 2013-05-14: qty 2

## 2013-05-14 MED ORDER — ONDANSETRON HCL 4 MG/2ML IJ SOLN
INTRAMUSCULAR | Status: AC
  Filled 2013-05-14: qty 2

## 2013-05-14 MED ORDER — CLINDAMYCIN PHOSPHATE 600 MG/50ML IV SOLN
600.0000 mg | Freq: Four times a day (QID) | INTRAVENOUS | Status: AC
  Administered 2013-05-15 (×2): 600 mg via INTRAVENOUS
  Filled 2013-05-14 (×2): qty 50

## 2013-05-14 MED ORDER — 0.9 % SODIUM CHLORIDE (POUR BTL) OPTIME
TOPICAL | Status: DC | PRN
  Administered 2013-05-14: 1000 mL

## 2013-05-14 MED ORDER — ONDANSETRON HCL 4 MG/2ML IJ SOLN
INTRAMUSCULAR | Status: DC | PRN
  Administered 2013-05-14: 4 mg via INTRAVENOUS

## 2013-05-14 MED ORDER — FENTANYL CITRATE 0.05 MG/ML IJ SOLN
INTRAMUSCULAR | Status: AC
Start: 2013-05-14 — End: 2013-05-14
  Filled 2013-05-14: qty 5

## 2013-05-14 MED ORDER — SODIUM CHLORIDE 0.9 % IV SOLN
INTRAVENOUS | Status: DC
  Filled 2013-05-14 (×2): qty 1000

## 2013-05-14 MED ORDER — CIPROFLOXACIN IN D5W 400 MG/200ML IV SOLN
400.0000 mg | Freq: Every day | INTRAVENOUS | Status: DC
  Administered 2013-05-14 – 2013-05-17 (×4): 400 mg via INTRAVENOUS
  Filled 2013-05-14 (×4): qty 200

## 2013-05-14 MED ORDER — ACETAMINOPHEN 10 MG/ML IV SOLN
INTRAVENOUS | Status: AC
  Filled 2013-05-14: qty 100

## 2013-05-14 MED ORDER — IRBESARTAN 150 MG PO TABS
150.0000 mg | ORAL_TABLET | Freq: Every day | ORAL | Status: DC
  Filled 2013-05-14 (×3): qty 1

## 2013-05-14 MED ORDER — ONDANSETRON HCL 4 MG PO TABS: 4.0000 mg | ORAL_TABLET | Freq: Four times a day (QID) | ORAL | Status: DC | PRN

## 2013-05-14 MED ORDER — MENTHOL 3 MG MT LOZG: 1.0000 | LOZENGE | OROMUCOSAL | Status: DC | PRN

## 2013-05-14 MED ORDER — ALBUMIN HUMAN 5 % IV SOLN
INTRAVENOUS | Status: DC | PRN
  Administered 2013-05-14: 17:00:00 via INTRAVENOUS

## 2013-05-14 MED ORDER — ETOMIDATE 2 MG/ML IV SOLN
INTRAVENOUS | Status: DC | PRN
  Administered 2013-05-14: 14 mg via INTRAVENOUS

## 2013-05-14 MED ORDER — DEXTROSE 5 % IV SOLN
10.0000 mg | INTRAVENOUS | Status: DC | PRN
  Administered 2013-05-14: 40 ug/min via INTRAVENOUS

## 2013-05-14 MED ORDER — LACTATED RINGERS IV SOLN
INTRAVENOUS | Status: DC | PRN
  Administered 2013-05-14: 16:00:00 via INTRAVENOUS

## 2013-05-14 MED ORDER — MIRTAZAPINE 7.5 MG PO TABS
7.5000 mg | ORAL_TABLET | Freq: Every day | ORAL | Status: DC
  Administered 2013-05-15: 7.5 mg via ORAL
  Filled 2013-05-14 (×4): qty 1

## 2013-05-14 MED ORDER — FENTANYL CITRATE 0.05 MG/ML IJ SOLN
INTRAMUSCULAR | Status: DC | PRN
  Administered 2013-05-14: 50 ug via INTRAVENOUS
  Administered 2013-05-14: 25 ug via INTRAVENOUS
  Administered 2013-05-14: 50 ug via INTRAVENOUS

## 2013-05-14 MED ORDER — ETOMIDATE 2 MG/ML IV SOLN
INTRAVENOUS | Status: AC
  Filled 2013-05-14: qty 10

## 2013-05-14 MED ORDER — ASPIRIN EC 325 MG PO TBEC: 325.0000 mg | DELAYED_RELEASE_TABLET | Freq: Every day | ORAL | Status: DC

## 2013-05-14 MED ORDER — NEOSTIGMINE METHYLSULFATE 1 MG/ML IJ SOLN
INTRAMUSCULAR | Status: DC | PRN
  Administered 2013-05-14: 4 mg via INTRAVENOUS

## 2013-05-14 MED ORDER — ACETAMINOPHEN 10 MG/ML IV SOLN: 1000.0000 mg | Freq: Once | INTRAVENOUS | Status: DC

## 2013-05-14 MED ORDER — ALBUMIN HUMAN 5 % IV SOLN
INTRAVENOUS | Status: AC
  Administered 2013-05-14: 12.5 g via INTRAVENOUS
  Filled 2013-05-14: qty 250

## 2013-05-14 MED ORDER — METOPROLOL SUCCINATE ER 25 MG PO TB24
25.0000 mg | ORAL_TABLET | Freq: Every day | ORAL | Status: DC
  Filled 2013-05-14 (×2): qty 1

## 2013-05-14 MED ORDER — PHENYLEPHRINE 40 MCG/ML (10ML) SYRINGE FOR IV PUSH (FOR BLOOD PRESSURE SUPPORT)
PREFILLED_SYRINGE | INTRAVENOUS | Status: AC
  Filled 2013-05-14: qty 10

## 2013-05-14 MED ORDER — ACETAMINOPHEN 500 MG PO TABS: 500.0000 mg | ORAL_TABLET | Freq: Four times a day (QID) | ORAL | Status: AC | PRN

## 2013-05-14 MED ORDER — SODIUM CHLORIDE 0.9 % IV SOLN
1000.0000 mL | INTRAVENOUS | Status: DC
  Administered 2013-05-14: 1000 mL via INTRAVENOUS

## 2013-05-14 MED ORDER — MORPHINE SULFATE 2 MG/ML IJ SOLN
0.5000 mg | INTRAMUSCULAR | Status: DC | PRN
  Administered 2013-05-14: 0.5 mg via INTRAVENOUS
  Filled 2013-05-14: qty 1

## 2013-05-14 MED ORDER — ROCURONIUM BROMIDE 100 MG/10ML IV SOLN
INTRAVENOUS | Status: DC | PRN
  Administered 2013-05-14: 30 mg via INTRAVENOUS

## 2013-05-14 MED ORDER — ONDANSETRON HCL 4 MG/2ML IJ SOLN
4.0000 mg | Freq: Once | INTRAMUSCULAR | Status: AC
  Administered 2013-05-14: 4 mg via INTRAVENOUS
  Filled 2013-05-14: qty 2

## 2013-05-14 MED ORDER — CLINDAMYCIN PHOSPHATE 900 MG/50ML IV SOLN
900.0000 mg | INTRAVENOUS | Status: AC
  Administered 2013-05-14: 900 mg via INTRAVENOUS
  Filled 2013-05-14 (×2): qty 50

## 2013-05-14 MED ORDER — FENTANYL CITRATE 0.05 MG/ML IJ SOLN
25.0000 ug | Freq: Once | INTRAMUSCULAR | Status: AC
  Administered 2013-05-14: 25 ug via INTRAVENOUS

## 2013-05-14 SURGICAL SUPPLY — 36 items
BIT DRILL CANN LG 4.3MM (BIT) IMPLANT
BLADE SURG 15 STRL LF DISP TIS (BLADE) ×1 IMPLANT
BLADE SURG 15 STRL SS (BLADE) ×3
COVER SURGICAL LIGHT HANDLE (MISCELLANEOUS) ×3 IMPLANT
COVER TABLE BACK 60X90 (DRAPES) ×3 IMPLANT
DRAPE C-ARM 42X72 X-RAY (DRAPES) ×3 IMPLANT
DRAPE STERI IOBAN 125X83 (DRAPES) ×3 IMPLANT
DRESSING ALLEVYN LIFE SACRUM (GAUZE/BANDAGES/DRESSINGS) ×2 IMPLANT
DRILL BIT CANN LG 4.3MM (BIT) ×3
DRSG ADAPTIC 3X8 NADH LF (GAUZE/BANDAGES/DRESSINGS) ×3 IMPLANT
DRSG MEPILEX BORDER 4X4 (GAUZE/BANDAGES/DRESSINGS) ×3 IMPLANT
DRSG MEPILEX BORDER 4X8 (GAUZE/BANDAGES/DRESSINGS) ×3 IMPLANT
ELECT REM PT RETURN 9FT ADLT (ELECTROSURGICAL) ×3
ELECTRODE REM PT RTRN 9FT ADLT (ELECTROSURGICAL) ×1 IMPLANT
EVACUATOR 1/8 PVC DRAIN (DRAIN) IMPLANT
GLOVE BIOGEL PI IND STRL 9 (GLOVE) ×1 IMPLANT
GLOVE BIOGEL PI INDICATOR 9 (GLOVE) ×2
GLOVE SURG ORTHO 9.0 STRL STRW (GLOVE) ×3 IMPLANT
GOWN STRL REUS W/ TWL XL LVL3 (GOWN DISPOSABLE) ×3 IMPLANT
GOWN STRL REUS W/TWL XL LVL3 (GOWN DISPOSABLE) ×9
HIP FRAC NAIL LAG SCR 10.5X100 (Orthopedic Implant) ×2 IMPLANT
KIT BASIN OR (CUSTOM PROCEDURE TRAY) ×3 IMPLANT
KIT ROOM TURNOVER OR (KITS) ×3 IMPLANT
MANIFOLD NEPTUNE II (INSTRUMENTS) ×3 IMPLANT
NAIL HIP FRACT 130D 11X180 (Screw) ×2 IMPLANT
NS IRRIG 1000ML POUR BTL (IV SOLUTION) ×3 IMPLANT
PACK GENERAL/GYN (CUSTOM PROCEDURE TRAY) ×3 IMPLANT
PAD ARMBOARD 7.5X6 YLW CONV (MISCELLANEOUS) ×6 IMPLANT
PIN GUIDE 3.2 903003004 (MISCELLANEOUS) ×2 IMPLANT
SCREW BONE CORTICAL 5.0X3 (Screw) ×2 IMPLANT
SCREW CANN THRD AFF 10.5X100 (Orthopedic Implant) IMPLANT
SPONGE GAUZE 4X4 12PLY STER LF (GAUZE/BANDAGES/DRESSINGS) ×2 IMPLANT
STAPLER VISISTAT 35W (STAPLE) IMPLANT
SUT VIC AB 2-0 CTB1 (SUTURE) IMPLANT
TAPE CLOTH SURG 4X10 WHT LF (GAUZE/BANDAGES/DRESSINGS) ×2 IMPLANT
WATER STERILE IRR 1000ML POUR (IV SOLUTION) ×6 IMPLANT

## 2013-05-14 NOTE — Progress Notes (Signed)
UR completed 

## 2013-05-14 NOTE — Anesthesia Preprocedure Evaluation (Signed)
Anesthesia Evaluation  Patient identified by MRN, date of birth, ID band Patient confused    Reviewed: Allergy & Precautions, H&P , NPO status , Patient's Chart, lab work & pertinent test results, reviewed documented beta blocker date and time   History of Anesthesia Complications Negative for: history of anesthetic complications  Airway Mallampati: II TM Distance: >3 FB Neck ROM: full    Dental  (+) Edentulous Upper, Edentulous Lower, Dental Advidsory Given   Pulmonary neg pulmonary ROS, resolved,          Cardiovascular hypertension, On Medications and On Home Beta Blockers + CAD and + Past MI + pacemaker     Neuro/Psych    GI/Hepatic Neg liver ROS, GERD-  Controlled,  Endo/Other  negative endocrine ROS  Renal/GU CRF     Musculoskeletal   Abdominal   Peds  Hematology  (+) anemia ,   Anesthesia Other Findings   Reproductive/Obstetrics negative OB ROS                           Anesthesia Physical Anesthesia Plan  ASA: II  Anesthesia Plan: General ETT   Post-op Pain Management:    Induction:   Airway Management Planned:   Additional Equipment:   Intra-op Plan:   Post-operative Plan:   Informed Consent:   Dental Advisory Given  Plan Discussed with:   Anesthesia Plan Comments:         Anesthesia Quick Evaluation

## 2013-05-14 NOTE — H&P (Addendum)
[facility] History and Physical  Felicia West ZOX:[id number] DOB: 02-Sep-1916 DOA: 05/14/2013  Referring physician: ER physician. PCP: Georgann Housekeeper, MD   Chief Complaint: Fall.  HPI: Felicia West is a 80 y.o. female with history of dementia, hypertension, sick sinus syndrome status post pacemaker placement had a fall while walking to the bathroom. Patient was found on the floor by patient's nurse and was brought to the ER and x-rays show right hip fracture. Patient is demented and does not contribute to the history. Patient otherwise denies any chest pain shortness of breath abdominal pain. CT head and neck done in the ER does not show anything acute. Labs to reveal mild leukocytosis with UA showing possible UTI. I have tried to reach patient's son but was able to reach patient's daughter-in-law.   Review of Systems: As presented in the history of presenting illness, rest negative.  Past Medical History  Diagnosis Date  . Hypertension   . Hyperlipidemia   . SSS (sick sinus syndrome)   . CAD (coronary artery disease)   . Dementia   . MI (myocardial infarction)   . GERD (gastroesophageal reflux disease)   . Diverticulosis   . Osteoporosis    Past Surgical History  Procedure Laterality Date  . Knee surgery    . Pacemaker insertion    . Cardiac catheterization  09/09/02  . Vascular surgery    . Cholecystectomy    . Colectomy     Social History:  reports that she has never smoked. She has never used smokeless tobacco. She reports that she does not drink alcohol or use illicit drugs. Where does patient live nursing home. Can patient participate in ADLs? Unsure.  Allergies  Allergen Reactions  . Penicillins Other (See Comments)    Pt states she "blacks out"  . Lidocaine     Family History:  Family History  Problem Relation Age of Onset  . Colon cancer Father   . Stroke Brother   . Pneumonia Mother       Prior to Admission medications   Medication Sig Start  Date End Date Taking? Authorizing Provider  acetaminophen (TYLENOL) 650 MG CR tablet Take 650 mg by mouth every 4 (four) hours as needed for pain (headache').   Yes Historical Provider, MD  aspirin EC 325 MG tablet Take 325 mg by mouth daily.    Yes Historical Provider, MD  cholecalciferol (VITAMIN D) 1000 UNITS tablet Take 1,000 Units by mouth daily.   Yes Historical Provider, MD  escitalopram (LEXAPRO) 20 MG tablet Take 20 mg by mouth daily.   Yes Historical Provider, MD  irbesartan (AVAPRO) 150 MG tablet Take 150 mg by mouth daily.   Yes Historical Provider, MD  metoprolol succinate (TOPROL-XL) 25 MG 24 hr tablet Take 25 mg by mouth daily.    Yes Historical Provider, MD  mirtazapine (REMERON) 15 MG tablet Take 7.5 mg by mouth at bedtime.    Yes Historical Provider, MD  Multiple Vitamin (MULITIVITAMIN WITH MINERALS) TABS Take 1 tablet by mouth daily.   Yes Historical Provider, MD  PRESCRIPTION MEDICATION Take 120 mLs by mouth 4 (four) times daily - after meals and at bedtime. Med Pass   Yes Historical Provider, MD  vitamin E 400 UNIT capsule Take 400 Units by mouth daily.    Yes Historical Provider, MD  QUEtiapine (SEROQUEL) 25 MG tablet Take 1 tablet (25 mg total) by mouth at bedtime. 12/05/10 01/04/11  Georgann Housekeeper, MD    Physical Exam: Filed Vitals:  05/14/13 0124 05/14/13 0342 05/14/13 0552 05/14/13 0631  BP:  124/61 121/47 131/78  Pulse:  70 76 77  Temp:  97.9 F (36.6 C) 97.6 F (36.4 C) 97.3 F (36.3 C)  TempSrc:  Oral Oral Oral  Resp:  16 20 18   Weight:    69.1 kg (152 lb 5.4 oz)  SpO2: 100% 100% 100% 100%     General:  Well-developed and moderately nourished.  Eyes: Anicteric no pallor.  ENT: No discharge from ears eyes nose mouth.  Neck: No mass felt.  Cardiovascular: S1-S2 heard.  Respiratory: No rhonchi or crepitations.  Abdomen: Soft nontender bowel sounds present. No guarding or rigidity.  Skin: No rash.  Musculoskeletal: Pain on moving right  hip.  Psychiatric: Patient is demented.  Neurologic: Alert awake oriented to her name and follows commands.  Labs on Admission:  Basic Metabolic Panel:  Recent Labs Lab 05/14/13 0308  NA 137  K 4.0  CL 98  CO2 26  GLUCOSE 127*  BUN 18  CREATININE 1.23*  CALCIUM 8.6   Liver Function Tests: No results found for this basename: AST, ALT, ALKPHOS, BILITOT, PROT, ALBUMIN,  in the last 168 hours No results found for this basename: LIPASE, AMYLASE,  in the last 168 hours No results found for this basename: AMMONIA,  in the last 168 hours CBC:  Recent Labs Lab 05/14/13 0308  WBC 12.1*  NEUTROABS 8.7*  HGB 10.3*  HCT 32.2*  MCV 91.7  PLT 155   Cardiac Enzymes:  Recent Labs Lab 05/14/13 0502  TROPONINI <0.30    BNP (last 3 results) No results found for this basename: PROBNP,  in the last 8760 hours CBG: No results found for this basename: GLUCAP,  in the last 168 hours  Radiological Exams on Admission: Dg Chest 1 View  05/14/2013   CLINICAL DATA:  Preop hip fracture.  EXAM: CHEST - 1 VIEW  COMPARISON:  DG CHEST 2 VIEW dated 03/12/2013  FINDINGS: Cardiac pacemaker. Mild hyperinflation. No focal airspace disease or consolidation in the lungs. Chronic bronchitic changes. Normal heart size and pulmonary vascularity. No significant change since previous study.  IMPRESSION: No active disease.   Electronically Signed   By: Burman Nieves M.D.   On: 05/14/2013 02:53   Dg Hip Complete Right  05/14/2013   CLINICAL DATA:  Right hip pain after a fall.  EXAM: RIGHT HIP - COMPLETE 2+ VIEW  COMPARISON:  CT HIP*R* W/O CM dated 11/29/2010; DG HIP COMPLETE*R* dated 11/29/2010  FINDINGS: Comminuted inter trochanteric fractures of the proximal right femur with varus angulation of the fracture fragments. No dislocation of the hip joint. Pelvis and left hip appear intact. Vascular calcifications.  IMPRESSION: Comminuted inter trochanteric fractures of the proximal right femur.   Electronically  Signed   By: Burman Nieves M.D.   On: 05/14/2013 02:52   Ct Head Wo Contrast  05/14/2013   CLINICAL DATA:  Fall from the head.  EXAM: CT HEAD WITHOUT CONTRAST  CT CERVICAL SPINE WITHOUT CONTRAST  TECHNIQUE: Multidetector CT imaging of the head and cervical spine was performed following the standard protocol without intravenous contrast. Multiplanar CT image reconstructions of the cervical spine were also generated.  COMPARISON:  CT HEAD W/O CM dated 03/12/2013; CT C SPINE W/O CM dated 03/12/2013  FINDINGS: CT HEAD FINDINGS  The ventricles and sulci are normal for age. No intraparenchymal hemorrhage, mass effect nor midline shift. Patchy to confluent supratentorial white matter hypodensities are within normal range for patient's age  and though non-specific suggest sequelae of chronic small vessel ischemic disease. No acute large vascular territory infarcts. Remote small left inferior cerebellar infarct. Multiple remote bilateral basal ganglia lacunar infarcts.  No abnormal extra-axial fluid collections. Basal cisterns are patent. Moderate calcific atherosclerosis of the carotid siphons and included vertebral arteries.  No skull fracture. Patient is osteopenic. The included ocular globes and orbital contents are non-suspicious, status post right ocular lens implant. Mild paranasal sinus mucosal thickening without air-fluid levels. The mastoid air cells are well aerated. Patient is edentulous.  CT CERVICAL SPINE FINDINGS  Cervical vertebral bodies and posterior elements are intact and aligned with maintenance of the cervical lordosis. Moderate C5-6 and C6-7 degenerative disc disease, mild to moderate C4-5. C1-2 articulation maintained with moderate to severe arthropathy. Mild calcified pannus about the odontoid process can be seen with CPPD. Bone mineral density is decreased without destructive bony lesions. Moderate calcific atherosclerosis of the carotid bulbs.  Degenerative disc disease and facet arthropathy  without canal stenosis. Severe C5-6 and moderate to severe left C4-5 neural foraminal narrowing.  IMPRESSION: CT head:  No acute intracranial process.  Stable appearance of the head: Moderate global brain atrophy with moderate to severe white matter changes suggesting chronic small vessel ischemic disease.  CT cervical spine:  No acute fracture nor malalignment.  Stable degenerative change of the cervical spine.   Electronically Signed   By: Awilda Metroourtnay  Bloomer   On: 05/14/2013 03:00   Ct Cervical Spine Wo Contrast  05/14/2013   CLINICAL DATA:  Fall from the head.  EXAM: CT HEAD WITHOUT CONTRAST  CT CERVICAL SPINE WITHOUT CONTRAST  TECHNIQUE: Multidetector CT imaging of the head and cervical spine was performed following the standard protocol without intravenous contrast. Multiplanar CT image reconstructions of the cervical spine were also generated.  COMPARISON:  CT HEAD W/O CM dated 03/12/2013; CT C SPINE W/O CM dated 03/12/2013  FINDINGS: CT HEAD FINDINGS  The ventricles and sulci are normal for age. No intraparenchymal hemorrhage, mass effect nor midline shift. Patchy to confluent supratentorial white matter hypodensities are within normal range for patient's age and though non-specific suggest sequelae of chronic small vessel ischemic disease. No acute large vascular territory infarcts. Remote small left inferior cerebellar infarct. Multiple remote bilateral basal ganglia lacunar infarcts.  No abnormal extra-axial fluid collections. Basal cisterns are patent. Moderate calcific atherosclerosis of the carotid siphons and included vertebral arteries.  No skull fracture. Patient is osteopenic. The included ocular globes and orbital contents are non-suspicious, status post right ocular lens implant. Mild paranasal sinus mucosal thickening without air-fluid levels. The mastoid air cells are well aerated. Patient is edentulous.  CT CERVICAL SPINE FINDINGS  Cervical vertebral bodies and posterior elements are intact and  aligned with maintenance of the cervical lordosis. Moderate C5-6 and C6-7 degenerative disc disease, mild to moderate C4-5. C1-2 articulation maintained with moderate to severe arthropathy. Mild calcified pannus about the odontoid process can be seen with CPPD. Bone mineral density is decreased without destructive bony lesions. Moderate calcific atherosclerosis of the carotid bulbs.  Degenerative disc disease and facet arthropathy without canal stenosis. Severe C5-6 and moderate to severe left C4-5 neural foraminal narrowing.  IMPRESSION: CT head:  No acute intracranial process.  Stable appearance of the head: Moderate global brain atrophy with moderate to severe white matter changes suggesting chronic small vessel ischemic disease.  CT cervical spine:  No acute fracture nor malalignment.  Stable degenerative change of the cervical spine.   Electronically Signed   By: Pernell Dupreourtnay  Bloomer   On: 05/14/2013 03:00    EKG: Independently reviewed. Paced rhythm.  Assessment/Plan Principal Problem:   Closed right hip fracture Active Problems:   Encephalopathy acute   SSS (sick sinus syndrome)   CAD (coronary artery disease)   Anemia   Hip fracture   1. Right hip fracture status post fall - I have discussed with Dr. Lajoyce Cornersuda, orthopedic surgeon, who will be seeing patient in consult. In anticipation of possible surgery patient will be kept n.p.o. From medical standpoint of view patient looks stable for surgery. 2. Leukocytosis with possible UTI - check urine culture. Patient has been placed on ciprofloxacin. 3. Hypertension - continue present medications including Beta blocker.  4. Mild renal insufficiency - follow metabolic panel. 5. Anemia  - follow CBC. Type and screen and hold.  6. Sick sinus syndrome status post pacemaker placement. 7. Dementia - continue present medications.  Discussed with Dr. Tyson DenseKarrar Hussain. Patient will be admitted to [facility].  Code Status:  DO NOT RESUSCITATE.    Family Communication:  patient's daughter-in-law. Patient's son is on his way.   Disposition Plan: Admit to inpatient.     Eduard ClosArshad N Kakrakandy [facility] Pager [phone].   If 7PM-7AM, please contact night-coverage [email or web address] Password [email or web address] 05/14/2013, 6:41 AM

## 2013-05-14 NOTE — ED Notes (Signed)
Patient transported to X-ray 

## 2013-05-14 NOTE — Progress Notes (Addendum)
INITIAL NUTRITION ASSESSMENT  DOCUMENTATION CODES Per approved criteria  -Moderate (non-severe) malnutrition in context of chronic illness  Pt meets criteria for moderate MALNUTRITION in the context of chronic illness as evidenced by 5% weight loss in one month, PO intake likely, <75% for > one month.   INTERVENTION: -Discussed high kcal/protein snack options-will add nourishments TID w/diet advancement -Recommend Ensure Pudding po TID, each supplement provides 170 kcal and 4 grams of protein -Will continue to monitor  NUTRITION DIAGNOSIS: Inadequate oral intake related to illness/dementia as evidenced by PO intake <75%, 10 lb unintentional wt loss.   Goal: Pt to meet >/= 90% of their estimated nutrition needs    Monitor:  Diet order, total protein/energy intake, labs, weights  Reason for Assessment: Consult/MST  80 y.o. female  Admitting Dx: Closed right hip fracture  ASSESSMENT: Felicia West is a 80 y.o. female with history of dementia, hypertension, sick sinus syndrome status post pacemaker placement had a fall while walking to the bathroom. Patient was found on the floor by patient's nurse and was brought to the ER and x-rays show right hip fracture  -Pt resident of SNF -Pt's son reported overall decreased appetite d/t dementia. Pt refuses meals and beverages at SNF, making it difficult for pt to maintain hydration -Consumes mostly snacks at SNF, family and staff members will provide pt with cookies, crackers, and rice krispy treats -Enjoys very simple bland foods-toast, cheerios, meats w/out gravies. Denied dysphagia, however, will often refuse to wear dentures -Snacks are often given before meal times, which makes it difficult for family to encourage PO intake of meals. Refuses Ensure/Boost supplements -Was dx with pneumonia in 01/2013, has experienced an unintentional wt loss of 10 lbs in 3 month period per son -NPO for right hip surgery -Will incorporate snacks upon  diet advancement-10AM: graham crackers and peanut butter, 2PM-crackers and cheese, and 8PM-cookies and whole milk -Pt with mild renal insufficiency per MD, likely r/t to minimal beverage intake, slight dehydration  Height: Ht Readings from Last 1 Encounters:  05/14/13 5' 6.93" (1.7 m)    Weight: Wt Readings from Last 1 Encounters:  05/14/13 152 lb 5.4 oz (69.1 kg)    Ideal Body Weight: 148 lbs  % Ideal Body Weight: 103%  Wt Readings from Last 10 Encounters:  05/14/13 152 lb 5.4 oz (69.1 kg)  05/14/13 152 lb 5.4 oz (69.1 kg)  05/14/13 152 lb 5.4 oz (69.1 kg)  03/30/11 161 lb (73.029 kg)  03/30/11 161 lb (73.029 kg)  03/08/11 161 lb 1.9 oz (73.084 kg)  11/30/10 162 lb 4.1 oz (73.6 kg)    Usual Body Weight: 160 lbs  % Usual Body Weight: 95%  BMI:  Body mass index is 23.91 kg/(m^2).  Estimated Nutritional Needs: Kcal: 1700-1900  Protein: 70-80 gram Fluid: >/=1700 ml/daily  Skin: WDL, no edema  Diet Order: NPO  EDUCATION NEEDS: -No education needs identified at this time   Intake/Output Summary (Last 24 hours) at 05/14/13 1032 Last data filed at 05/14/13 1022  Gross per 24 hour  Intake      0 ml  Output    600 ml  Net   -600 ml    Last BM: pta   Labs:   Recent Labs Lab 05/14/13 0308 05/14/13 0634  NA 137 138  K 4.0 4.7  CL 98 103  CO2 26 27  BUN 18 19  CREATININE 1.23* 1.30*  CALCIUM 8.6 8.3*  GLUCOSE 127* 141*    CBG (last 3)  Recent Labs  05/14/13 0815  GLUCAP 122*    Scheduled Meds: . chlorhexidine  60 mL Topical Once  . ciprofloxacin  400 mg Intravenous QAC breakfast  . clindamycin (CLEOCIN) IV  900 mg Intravenous On Call to OR  . escitalopram  20 mg Oral Daily  . irbesartan  150 mg Oral Daily  . metoprolol succinate  25 mg Oral Daily  . mirtazapine  7.5 mg Oral QHS  . QUEtiapine  25 mg Oral QHS    Continuous Infusions: . sodium chloride 0.9 % 1,000 mL infusion 75 mL/hr at 05/14/13 0930    Past Medical History  Diagnosis  Date  . Hypertension   . Hyperlipidemia   . SSS (sick sinus syndrome)   . CAD (coronary artery disease)   . Dementia   . MI (myocardial infarction)   . GERD (gastroesophageal reflux disease)   . Diverticulosis   . Osteoporosis     Past Surgical History  Procedure Laterality Date  . Knee surgery    . Pacemaker insertion    . Cardiac catheterization  09/09/02  . Vascular surgery    . Cholecystectomy    . Colectomy      Lloyd HugerSarah F Salvador Coupe MS RD LDN Clinical Dietitian Pager:[phone]

## 2013-05-14 NOTE — Transfer of Care (Signed)
Immediate Anesthesia Transfer of Care Note  Patient: Felicia West  Procedure(s) Performed: Procedure(s): INTRAMEDULLARY (IM) NAIL INTERTROCHANTRIC (Right)  Patient Location: PACU  Anesthesia Type:General  Level of Consciousness: awake, alert  and patient cooperative  Airway & Oxygen Therapy: Patient Spontanous Breathing and Patient connected to nasal cannula oxygen  Post-op Assessment: Report given to PACU RN and Post -op Vital signs reviewed and stable  Post vital signs: Reviewed and stable  Complications: No apparent anesthesia complications

## 2013-05-14 NOTE — Consult Note (Signed)
Reason for Consult: Subtrochanteric right hip fracture Referring Physician: Dr Sanjuan Felicia West is an 80 y.o. female.  HPI: Patient is a 80 year old woman with dementia status post mechanical fall with right hip pain  Past Medical History  Diagnosis Date  . Hypertension   . Hyperlipidemia   . SSS (sick sinus syndrome)   . CAD (coronary artery disease)   . Dementia   . MI (myocardial infarction)   . GERD (gastroesophageal reflux disease)   . Diverticulosis   . Osteoporosis     Past Surgical History  Procedure Laterality Date  . Knee surgery    . Pacemaker insertion    . Cardiac catheterization  09/09/02  . Vascular surgery    . Cholecystectomy    . Colectomy      Family History  Problem Relation Age of Onset  . Colon cancer Father   . Stroke Brother   . Pneumonia Mother     Social History:  reports that she has never smoked. She has never used smokeless tobacco. She reports that she does not drink alcohol or use illicit drugs.  Allergies:  Allergies  Allergen Reactions  . Penicillins Other (See Comments)    Pt states she "blacks out"  . Lidocaine     Medications: I have reviewed the patient's current medications.  Results for orders placed during the hospital encounter of 05/14/13 (from the past 48 hour(s))  BASIC METABOLIC PANEL     Status: Abnormal   Collection Time    05/14/13  3:08 AM      Result Value Ref Range   Sodium 137  137 - 147 mEq/L   Potassium 4.0  3.7 - 5.3 mEq/L   Chloride 98  96 - 112 mEq/L   CO2 26  19 - 32 mEq/L   Glucose, Bld 127 (*) 70 - 99 mg/dL   BUN 18  6 - 23 mg/dL   Creatinine, Ser 1.23 (*) 0.50 - 1.10 mg/dL   Calcium 8.6  8.4 - 10.5 mg/dL   GFR calc non Af Amer 36 (*) >90 mL/min   GFR calc Af Amer 42 (*) >90 mL/min   Comment: (NOTE)     The eGFR has been calculated using the CKD EPI equation.     This calculation has not been validated in all clinical situations.     eGFR's persistently <90 mL/min signify possible  Chronic Kidney     Disease.  CBC WITH DIFFERENTIAL     Status: Abnormal   Collection Time    05/14/13  3:08 AM      Result Value Ref Range   WBC 12.1 (*) 4.0 - 10.5 K/uL   RBC 3.51 (*) 3.87 - 5.11 MIL/uL   Hemoglobin 10.3 (*) 12.0 - 15.0 g/dL   HCT 32.2 (*) 36.0 - 46.0 %   MCV 91.7  78.0 - 100.0 fL   MCH 29.3  26.0 - 34.0 pg   MCHC 32.0  30.0 - 36.0 g/dL   RDW 15.2  11.5 - 15.5 %   Platelets 155  150 - 400 K/uL   Neutrophils Relative % 72  43 - 77 %   Neutro Abs 8.7 (*) 1.7 - 7.7 K/uL   Lymphocytes Relative 19  12 - 46 %   Lymphs Abs 2.3  0.7 - 4.0 K/uL   Monocytes Relative 7  3 - 12 %   Monocytes Absolute 0.8  0.1 - 1.0 K/uL   Eosinophils Relative 2  0 - 5 %  Eosinophils Absolute 0.2  0.0 - 0.7 K/uL   Basophils Relative 0  0 - 1 %   Basophils Absolute 0.0  0.0 - 0.1 K/uL  PROTIME-INR     Status: None   Collection Time    05/14/13  3:08 AM      Result Value Ref Range   Prothrombin Time 13.1  11.6 - 15.2 seconds   INR 1.01  0.00 - 1.49  TYPE AND SCREEN     Status: None   Collection Time    05/14/13  3:08 AM      Result Value Ref Range   ABO/RH(D) A POS     Antibody Screen NEG     Sample Expiration 05/17/2013    ABO/RH     Status: None   Collection Time    05/14/13  3:08 AM      Result Value Ref Range   ABO/RH(D) A POS    URINALYSIS, ROUTINE W REFLEX MICROSCOPIC     Status: Abnormal   Collection Time    05/14/13  3:42 AM      Result Value Ref Range   Color, Urine YELLOW  YELLOW   APPearance CLOUDY (*) CLEAR   Specific Gravity, Urine 1.008  1.005 - 1.030   pH 5.5  5.0 - 8.0   Glucose, UA NEGATIVE  NEGATIVE mg/dL   Hgb urine dipstick NEGATIVE  NEGATIVE   Bilirubin Urine NEGATIVE  NEGATIVE   Ketones, ur NEGATIVE  NEGATIVE mg/dL   Protein, ur NEGATIVE  NEGATIVE mg/dL   Urobilinogen, UA 0.2  0.0 - 1.0 mg/dL   Nitrite POSITIVE (*) NEGATIVE   Leukocytes, UA MODERATE (*) NEGATIVE  URINE MICROSCOPIC-ADD ON     Status: Abnormal   Collection Time    05/14/13  3:42 AM       Result Value Ref Range   Squamous Epithelial / LPF RARE  RARE   WBC, UA 7-10  <3 WBC/hpf   Comment: WITH CLUMPS   Bacteria, UA MANY (*) RARE  TROPONIN I     Status: None   Collection Time    05/14/13  5:02 AM      Result Value Ref Range   Troponin I <0.30  <0.30 ng/mL   Comment:            Due to the release kinetics of cTnI,     a negative result within the first hours     of the onset of symptoms does not rule out     myocardial infarction with certainty.     If myocardial infarction is still suspected,     repeat the test at appropriate intervals.  COMPREHENSIVE METABOLIC PANEL     Status: Abnormal   Collection Time    05/14/13  6:34 AM      Result Value Ref Range   Sodium 138  137 - 147 mEq/L   Potassium 4.7  3.7 - 5.3 mEq/L   Chloride 103  96 - 112 mEq/L   CO2 27  19 - 32 mEq/L   Glucose, Bld 141 (*) 70 - 99 mg/dL   BUN 19  6 - 23 mg/dL   Creatinine, Ser 1.30 (*) 0.50 - 1.10 mg/dL   Calcium 8.3 (*) 8.4 - 10.5 mg/dL   Total Protein 5.0 (*) 6.0 - 8.3 g/dL   Albumin 2.2 (*) 3.5 - 5.2 g/dL   AST 21  0 - 37 U/L   ALT 9  0 - 35 U/L   Alkaline Phosphatase  73  39 - 117 U/L   Total Bilirubin 0.4  0.3 - 1.2 mg/dL   GFR calc non Af Amer 34 (*) >90 mL/min   GFR calc Af Amer 39 (*) >90 mL/min   Comment: (NOTE)     The eGFR has been calculated using the CKD EPI equation.     This calculation has not been validated in all clinical situations.     eGFR's persistently <90 mL/min signify possible Chronic Kidney     Disease.  CBC WITH DIFFERENTIAL     Status: Abnormal   Collection Time    05/14/13  6:34 AM      Result Value Ref Range   WBC 12.4 (*) 4.0 - 10.5 K/uL   RBC 3.04 (*) 3.87 - 5.11 MIL/uL   Hemoglobin 8.9 (*) 12.0 - 15.0 g/dL   HCT 27.9 (*) 36.0 - 46.0 %   MCV 91.8  78.0 - 100.0 fL   MCH 29.3  26.0 - 34.0 pg   MCHC 31.9  30.0 - 36.0 g/dL   RDW 15.3  11.5 - 15.5 %   Platelets 138 (*) 150 - 400 K/uL   Neutrophils Relative % 88 (*) 43 - 77 %   Neutro Abs 10.9 (*)  1.7 - 7.7 K/uL   Lymphocytes Relative 7 (*) 12 - 46 %   Lymphs Abs 0.9  0.7 - 4.0 K/uL   Monocytes Relative 5  3 - 12 %   Monocytes Absolute 0.6  0.1 - 1.0 K/uL   Eosinophils Relative 0  0 - 5 %   Eosinophils Absolute 0.0  0.0 - 0.7 K/uL   Basophils Relative 0  0 - 1 %   Basophils Absolute 0.0  0.0 - 0.1 K/uL  GLUCOSE, CAPILLARY     Status: Abnormal   Collection Time    05/14/13  8:15 AM      Result Value Ref Range   Glucose-Capillary 122 (*) 70 - 99 mg/dL   Comment 1 Documented in Chart     Comment 2 Notify RN    VITAMIN B12     Status: None   Collection Time    05/14/13 10:55 AM      Result Value Ref Range   Vitamin B-12 493  211 - 911 pg/mL   Comment: Performed at [facility]     Status: None   Collection Time    05/14/13 10:55 AM      Result Value Ref Range   Folate >20.0     Comment: (NOTE)     Reference Ranges            Deficient:       0.4 - 3.3 ng/mL            Indeterminate:   3.4 - 5.4 ng/mL            Normal:              > 5.4 ng/mL     Performed at [facility] TIBC     Status: Abnormal   Collection Time    05/14/13 10:55 AM      Result Value Ref Range   Iron 34 (*) 42 - 135 ug/dL   TIBC 186 (*) 250 - 470 ug/dL   Saturation Ratios 18 (*) 20 - 55 %   UIBC 152  125 - 400 ug/dL   Comment: Performed at [facility]  Status: None   Collection Time    05/14/13 10:55 AM      Result Value Ref Range   Ferritin 127  10 - 291 ng/mL   Comment: Performed at [facility]  MAGNESIUM     Status: None   Collection Time    05/14/13 10:55 AM      Result Value Ref Range   Magnesium 1.9  1.5 - 2.5 mg/dL  TYPE AND SCREEN     Status: None   Collection Time    05/14/13  3:39 PM      Result Value Ref Range   ABO/RH(D) A POS     Antibody Screen NEG     Sample Expiration 05/17/2013    ABO/RH     Status: None   Collection Time    05/14/13  3:39 PM      Result Value Ref Range   ABO/RH(D) A POS      Dg  Chest 1 View  05/14/2013   CLINICAL DATA:  Preop hip fracture.  EXAM: CHEST - 1 VIEW  COMPARISON:  DG CHEST 2 VIEW dated 03/12/2013  FINDINGS: Cardiac pacemaker. Mild hyperinflation. No focal airspace disease or consolidation in the lungs. Chronic bronchitic changes. Normal heart size and pulmonary vascularity. No significant change since previous study.  IMPRESSION: No active disease.   Electronically Signed   By: Burman Nieves M.D.   On: 05/14/2013 02:53   Dg Hip Complete Right  05/14/2013   CLINICAL DATA:  Right hip pain after a fall.  EXAM: RIGHT HIP - COMPLETE 2+ VIEW  COMPARISON:  CT HIP*R* W/O CM dated 11/29/2010; DG HIP COMPLETE*R* dated 11/29/2010  FINDINGS: Comminuted inter trochanteric fractures of the proximal right femur with varus angulation of the fracture fragments. No dislocation of the hip joint. Pelvis and left hip appear intact. Vascular calcifications.  IMPRESSION: Comminuted inter trochanteric fractures of the proximal right femur.   Electronically Signed   By: Burman Nieves M.D.   On: 05/14/2013 02:52   Ct Head Wo Contrast  05/14/2013   CLINICAL DATA:  Fall from the head.  EXAM: CT HEAD WITHOUT CONTRAST  CT CERVICAL SPINE WITHOUT CONTRAST  TECHNIQUE: Multidetector CT imaging of the head and cervical spine was performed following the standard protocol without intravenous contrast. Multiplanar CT image reconstructions of the cervical spine were also generated.  COMPARISON:  CT HEAD W/O CM dated 03/12/2013; CT C SPINE W/O CM dated 03/12/2013  FINDINGS: CT HEAD FINDINGS  The ventricles and sulci are normal for age. No intraparenchymal hemorrhage, mass effect nor midline shift. Patchy to confluent supratentorial white matter hypodensities are within normal range for patient's age and though non-specific suggest sequelae of chronic small vessel ischemic disease. No acute large vascular territory infarcts. Remote small left inferior cerebellar infarct. Multiple remote bilateral basal ganglia  lacunar infarcts.  No abnormal extra-axial fluid collections. Basal cisterns are patent. Moderate calcific atherosclerosis of the carotid siphons and included vertebral arteries.  No skull fracture. Patient is osteopenic. The included ocular globes and orbital contents are non-suspicious, status post right ocular lens implant. Mild paranasal sinus mucosal thickening without air-fluid levels. The mastoid air cells are well aerated. Patient is edentulous.  CT CERVICAL SPINE FINDINGS  Cervical vertebral bodies and posterior elements are intact and aligned with maintenance of the cervical lordosis. Moderate C5-6 and C6-7 degenerative disc disease, mild to moderate C4-5. C1-2 articulation maintained with moderate to severe arthropathy. Mild calcified pannus about the odontoid process can be seen with  CPPD. Bone mineral density is decreased without destructive bony lesions. Moderate calcific atherosclerosis of the carotid bulbs.  Degenerative disc disease and facet arthropathy without canal stenosis. Severe C5-6 and moderate to severe left C4-5 neural foraminal narrowing.  IMPRESSION: CT head:  No acute intracranial process.  Stable appearance of the head: Moderate global brain atrophy with moderate to severe white matter changes suggesting chronic small vessel ischemic disease.  CT cervical spine:  No acute fracture nor malalignment.  Stable degenerative change of the cervical spine.   Electronically Signed   By: Elon Alas   On: 05/14/2013 03:00   Ct Cervical Spine Wo Contrast  05/14/2013   CLINICAL DATA:  Fall from the head.  EXAM: CT HEAD WITHOUT CONTRAST  CT CERVICAL SPINE WITHOUT CONTRAST  TECHNIQUE: Multidetector CT imaging of the head and cervical spine was performed following the standard protocol without intravenous contrast. Multiplanar CT image reconstructions of the cervical spine were also generated.  COMPARISON:  CT HEAD W/O CM dated 03/12/2013; CT C SPINE W/O CM dated 03/12/2013  FINDINGS: CT HEAD  FINDINGS  The ventricles and sulci are normal for age. No intraparenchymal hemorrhage, mass effect nor midline shift. Patchy to confluent supratentorial white matter hypodensities are within normal range for patient's age and though non-specific suggest sequelae of chronic small vessel ischemic disease. No acute large vascular territory infarcts. Remote small left inferior cerebellar infarct. Multiple remote bilateral basal ganglia lacunar infarcts.  No abnormal extra-axial fluid collections. Basal cisterns are patent. Moderate calcific atherosclerosis of the carotid siphons and included vertebral arteries.  No skull fracture. Patient is osteopenic. The included ocular globes and orbital contents are non-suspicious, status post right ocular lens implant. Mild paranasal sinus mucosal thickening without air-fluid levels. The mastoid air cells are well aerated. Patient is edentulous.  CT CERVICAL SPINE FINDINGS  Cervical vertebral bodies and posterior elements are intact and aligned with maintenance of the cervical lordosis. Moderate C5-6 and C6-7 degenerative disc disease, mild to moderate C4-5. C1-2 articulation maintained with moderate to severe arthropathy. Mild calcified pannus about the odontoid process can be seen with CPPD. Bone mineral density is decreased without destructive bony lesions. Moderate calcific atherosclerosis of the carotid bulbs.  Degenerative disc disease and facet arthropathy without canal stenosis. Severe C5-6 and moderate to severe left C4-5 neural foraminal narrowing.  IMPRESSION: CT head:  No acute intracranial process.  Stable appearance of the head: Moderate global brain atrophy with moderate to severe white matter changes suggesting chronic small vessel ischemic disease.  CT cervical spine:  No acute fracture nor malalignment.  Stable degenerative change of the cervical spine.   Electronically Signed   By: Elon Alas   On: 05/14/2013 03:00    Review of Systems  All other  systems reviewed and are negative.  Blood pressure 80/32, pulse 70, temperature 97.8 F (36.6 C), temperature source Oral, resp. rate 15, height 5' 6.93" (1.7 m), weight 69.1 kg (152 lb 5.4 oz), SpO2 97.00%. Physical Exam On examination patient has a good dorsalis pedis pulse. She has pain with attempted range of motion of the right lower extremity. Radiographs shows subtrochanteric right hip fracture. Assessment/Plan: Assessment: Subtrochanteric right hip fracture.  Plan: We will plan for intramedullary nail fixation for the right hip. Risks and benefits were discussed with the patient's son including infection neurovascular injury failure of fixation mortality need for additional surgery. Patient's son states he understands and wished to proceed at this time.  Felicia West 05/14/2013, 6:07 PM

## 2013-05-14 NOTE — Progress Notes (Signed)
Felicia West OR front desk notified that patient has a Environmental education officerBoston Scientific Pacemaker.

## 2013-05-14 NOTE — Progress Notes (Signed)
I have seen and assessed patient and agree with Dr Katherene PontoKakrakandy's assessment and plan. Patient without complaints. Patient denies any overt bleeding. Hemoglobin currently at 8.9 from 12.0 on 03/12/2013. May be dilutional component. Check an anemia panel. Transfuse as needed. Decrease IV fluids to 75 cc per hour. Patient for probable surgery later on this afternoon. Orthopedics following.

## 2013-05-14 NOTE — Care Management Note (Signed)
CARE MANAGEMENT NOTE 05/14/2013  Patient:  Felicia West,Felicia West   Account Number:  [id number]  Date Initiated:  05/14/2013  Documentation initiated by:  Tully Burgo  Subjective/Objective Assessment:   80 yo female admitted with right hip fx.     Action/Plan:   Home when stable   Anticipated DC Date:     Anticipated DC Plan:  SKILLED NURSING FACILITY  In-house referral  Clinical Social Worker      DC Associate Professorlanning Services  CM consult      Choice offered to / List presented to:  NA   DME arranged  NA      DME agency  NA     HH arranged  NA      HH agency  NA   Status of service:  Completed, signed off Medicare Important Message given?   (If response is "NO", the following Medicare IM given date fields will be blank) Date Medicare IM given:   Date Additional Medicare IM given:    Discharge Disposition:    Per UR Regulation:  Reviewed for med. necessity/level of care/duration of stay  If discussed at Long Length of Stay Meetings, dates discussed:    Comments:  05/14/13 1206 Felicia Mannsymeeka Denishia Citro,MSN,RN [phone] Chart reviewed for utilization of services. PTA pt from SNF. Pt disposition to SNF upon discharge. CSW following. CM to sign off.

## 2013-05-14 NOTE — Care Management Note (Signed)
[facility]. Physician Transfer Form signed by pt's son, MD, and [facility]. [facility] to [facility].     Roland Earlymeeka Blossom Crume,MSN,RN [phone]

## 2013-05-14 NOTE — ED Notes (Signed)
Bed: [id number] Expected date: 05/14/13 Expected time: 12:48 AM Means of arrival: Ambulance Comments: Hip deformity/[age]yr old

## 2013-05-14 NOTE — Progress Notes (Signed)
Patient reports severe pain. Unable to give additional narcotics d/t BP dropping

## 2013-05-14 NOTE — Progress Notes (Signed)
ANTIBIOTIC CONSULT NOTE - INITIAL  Pharmacy Consult for Ciprofloxacin Indication: UTI  Allergies  Allergen Reactions  . Penicillins Other (See Comments)    Pt states she "blacks out"  . Lidocaine     Patient Measurements: Height: 5' 6.93" (170 cm) Weight: 152 lb 5.4 oz (69.1 kg) IBW/kg (Calculated) : 61.44 Adjusted Body Weight:   Vital Signs: Temp: 97.3 F (36.3 C) (04/22 0631) Temp src: Oral (04/22 0631) BP: 131/78 mmHg (04/22 0631) Pulse Rate: 77 (04/22 0631) Intake/Output from previous day: 04/21 0701 - 04/22 0700 In: -  Out: 600 [Urine:600] Intake/Output from this shift: Total I/O In: -  Out: 600 [Urine:600]  Labs:  Recent Labs  05/14/13 0308  WBC 12.1*  HGB 10.3*  PLT 155  CREATININE 1.23*   Estimated Creatinine Clearance: 26.5 ml/min (by C-G formula based on Cr of 1.23). No results found for this basename: VANCOTROUGH, VANCOPEAK, VANCORANDOM, GENTTROUGH, GENTPEAK, GENTRANDOM, TOBRATROUGH, TOBRAPEAK, TOBRARND, AMIKACINPEAK, AMIKACINTROU, AMIKACIN,  in the last 72 hours   Microbiology: No results found for this or any previous visit (from the past 720 hour(s)).  Medical History: Past Medical History  Diagnosis Date  . Hypertension   . Hyperlipidemia   . SSS (sick sinus syndrome)   . CAD (coronary artery disease)   . Dementia   . MI (myocardial infarction)   . GERD (gastroesophageal reflux disease)   . Diverticulosis   . Osteoporosis     Medications:  Anti-infectives   Start     Dose/Rate Route Frequency Ordered Stop   05/14/13 0645  ciprofloxacin (CIPRO) IVPB 400 mg     400 mg 200 mL/hr over 60 Minutes Intravenous Daily before breakfast 05/14/13 [id number]       Assessment: Patient with UTI and poor renal function.    Goal of Therapy:  Ciprofloxacin dosed based on patient weight and renal function   Plan:  Follow up culture results Ciprofloxacin 400mg  iv q24hr  Jacquenette ShoneJulian Crowford Darlina GuysGrimsley Jr. 05/14/2013,6:45 AM

## 2013-05-14 NOTE — ED Provider Notes (Signed)
CSN: [id number]     Arrival date & time 05/14/13  0113 History   First MD Initiated Contact with Patient 05/14/13 0133     Chief Complaint  Patient presents with  . Fall     (Consider location/radiation/quality/duration/timing/severity/associated sxs/prior Treatment) HPI History provided by EMS. Nursing home patient, DNR, found down by nursing home staff. EMS called for transportation to the ER. Patient found to have right lower extremity deformity. She has history of dementia, provides no reliable or significant history. She denies any pain. She clearly has no recollection of fall or trauma. No reported injury occurred nursing home/EMS. Level V caveat applies Past Medical History  Diagnosis Date  . Hypertension   . Hyperlipidemia   . SSS (sick sinus syndrome)   . CAD (coronary artery disease)   . Dementia   . MI (myocardial infarction)   . GERD (gastroesophageal reflux disease)   . Diverticulosis   . Osteoporosis    Past Surgical History  Procedure Laterality Date  . Knee surgery    . Pacemaker insertion    . Cardiac catheterization  09/09/02  . Vascular surgery    . Cholecystectomy    . Colectomy     Family History  Problem Relation Age of Onset  . Colon cancer Father   . Stroke Brother   . Pneumonia Mother    History  Substance Use Topics  . Smoking status: Never Smoker   . Smokeless tobacco: Never Used  . Alcohol Use: No   OB History   Grav Para Term Preterm Abortions TAB SAB Ect Mult Living                 Review of Systems  Unable to perform ROS  level V caveat dementia    Allergies  Penicillins and Lidocaine  Home Medications   Prior to Admission medications   Medication Sig Start Date End Date Taking? Authorizing Provider  acetaminophen (TYLENOL) 650 MG CR tablet Take 650 mg by mouth every 4 (four) hours as needed for pain (headache').   Yes Historical Provider, MD  aspirin EC 325 MG tablet Take 325 mg by mouth daily.    Yes Historical Provider,  MD  cholecalciferol (VITAMIN D) 1000 UNITS tablet Take 1,000 Units by mouth daily.   Yes Historical Provider, MD  escitalopram (LEXAPRO) 20 MG tablet Take 20 mg by mouth daily.   Yes Historical Provider, MD  irbesartan (AVAPRO) 150 MG tablet Take 150 mg by mouth daily.   Yes Historical Provider, MD  metoprolol succinate (TOPROL-XL) 25 MG 24 hr tablet Take 25 mg by mouth daily.    Yes Historical Provider, MD  mirtazapine (REMERON) 15 MG tablet Take 7.5 mg by mouth at bedtime.    Yes Historical Provider, MD  Multiple Vitamin (MULITIVITAMIN WITH MINERALS) TABS Take 1 tablet by mouth daily.   Yes Historical Provider, MD  PRESCRIPTION MEDICATION Take 120 mLs by mouth 4 (four) times daily - after meals and at bedtime. Med Pass   Yes Historical Provider, MD  vitamin E 400 UNIT capsule Take 400 Units by mouth daily.    Yes Historical Provider, MD  QUEtiapine (SEROQUEL) 25 MG tablet Take 1 tablet (25 mg total) by mouth at bedtime. 12/05/10 01/04/11  Georgann HousekeeperKarrar Husain, MD   BP 103/56  Pulse 68  Temp(Src) 97.7 F (36.5 C) (Oral)  Resp 16  SpO2 100% Physical Exam  Constitutional: She is oriented to person, place, and time. She appears well-developed and well-nourished.  HENT:  Head:  Normocephalic and atraumatic.  Eyes: EOM are normal. Pupils are equal, round, and reactive to light.  Neck: Neck supple.  No cervical spine tenderness or deformity  Cardiovascular: Normal rate, regular rhythm and intact distal pulses.   Pulmonary/Chest: Effort normal and breath sounds normal. No respiratory distress. She exhibits no tenderness.  Abdominal: Soft. She exhibits no distension. There is no tenderness.  Musculoskeletal:  Pelvis stable. Right lower extremity shortening and external rotation. Distal motor and pulses intact. Limited sensory exam given dementia. No obvious neurovascular deficit.   Neurological: She is alert and oriented to person, place, and time.  Skin: Skin is warm and dry.    ED Course   Procedures (including critical care time) Labs Review Labs Reviewed  BASIC METABOLIC PANEL  CBC WITH DIFFERENTIAL  PROTIME-INR  URINALYSIS, ROUTINE W REFLEX MICROSCOPIC  TYPE AND SCREEN    Imaging Review Dg Chest 1 View  05/14/2013   CLINICAL DATA:  Preop hip fracture.  EXAM: CHEST - 1 VIEW  COMPARISON:  DG CHEST 2 VIEW dated 03/12/2013  FINDINGS: Cardiac pacemaker. Mild hyperinflation. No focal airspace disease or consolidation in the lungs. Chronic bronchitic changes. Normal heart size and pulmonary vascularity. No significant change since previous study.  IMPRESSION: No active disease.   Electronically Signed   By: Burman NievesWilliam  Stevens M.D.   On: 05/14/2013 02:53   Dg Hip Complete Right  05/14/2013   CLINICAL DATA:  Right hip pain after a fall.  EXAM: RIGHT HIP - COMPLETE 2+ VIEW  COMPARISON:  CT HIP*R* W/O CM dated 11/29/2010; DG HIP COMPLETE*R* dated 11/29/2010  FINDINGS: Comminuted inter trochanteric fractures of the proximal right femur with varus angulation of the fracture fragments. No dislocation of the hip joint. Pelvis and left hip appear intact. Vascular calcifications.  IMPRESSION: Comminuted inter trochanteric fractures of the proximal right femur.   Electronically Signed   By: Burman NievesWilliam  Stevens M.D.   On: 05/14/2013 02:52   Ct Head Wo Contrast  05/14/2013   CLINICAL DATA:  Fall from the head.  EXAM: CT HEAD WITHOUT CONTRAST  CT CERVICAL SPINE WITHOUT CONTRAST  TECHNIQUE: Multidetector CT imaging of the head and cervical spine was performed following the standard protocol without intravenous contrast. Multiplanar CT image reconstructions of the cervical spine were also generated.  COMPARISON:  CT HEAD W/O CM dated 03/12/2013; CT C SPINE W/O CM dated 03/12/2013  FINDINGS: CT HEAD FINDINGS  The ventricles and sulci are normal for age. No intraparenchymal hemorrhage, mass effect nor midline shift. Patchy to confluent supratentorial white matter hypodensities are within normal range for  patient's age and though non-specific suggest sequelae of chronic small vessel ischemic disease. No acute large vascular territory infarcts. Remote small left inferior cerebellar infarct. Multiple remote bilateral basal ganglia lacunar infarcts.  No abnormal extra-axial fluid collections. Basal cisterns are patent. Moderate calcific atherosclerosis of the carotid siphons and included vertebral arteries.  No skull fracture. Patient is osteopenic. The included ocular globes and orbital contents are non-suspicious, status post right ocular lens implant. Mild paranasal sinus mucosal thickening without air-fluid levels. The mastoid air cells are well aerated. Patient is edentulous.  CT CERVICAL SPINE FINDINGS  Cervical vertebral bodies and posterior elements are intact and aligned with maintenance of the cervical lordosis. Moderate C5-6 and C6-7 degenerative disc disease, mild to moderate C4-5. C1-2 articulation maintained with moderate to severe arthropathy. Mild calcified pannus about the odontoid process can be seen with CPPD. Bone mineral density is decreased without destructive bony lesions. Moderate calcific atherosclerosis  of the carotid bulbs.  Degenerative disc disease and facet arthropathy without canal stenosis. Severe C5-6 and moderate to severe left C4-5 neural foraminal narrowing.  IMPRESSION: CT head:  No acute intracranial process.  Stable appearance of the head: Moderate global brain atrophy with moderate to severe white matter changes suggesting chronic small vessel ischemic disease.  CT cervical spine:  No acute fracture nor malalignment.  Stable degenerative change of the cervical spine.   Electronically Signed   By: Awilda Metro   On: 05/14/2013 03:00   Ct Cervical Spine Wo Contrast  05/14/2013   CLINICAL DATA:  Fall from the head.  EXAM: CT HEAD WITHOUT CONTRAST  CT CERVICAL SPINE WITHOUT CONTRAST  TECHNIQUE: Multidetector CT imaging of the head and cervical spine was performed following the  standard protocol without intravenous contrast. Multiplanar CT image reconstructions of the cervical spine were also generated.  COMPARISON:  CT HEAD W/O CM dated 03/12/2013; CT C SPINE W/O CM dated 03/12/2013  FINDINGS: CT HEAD FINDINGS  The ventricles and sulci are normal for age. No intraparenchymal hemorrhage, mass effect nor midline shift. Patchy to confluent supratentorial white matter hypodensities are within normal range for patient's age and though non-specific suggest sequelae of chronic small vessel ischemic disease. No acute large vascular territory infarcts. Remote small left inferior cerebellar infarct. Multiple remote bilateral basal ganglia lacunar infarcts.  No abnormal extra-axial fluid collections. Basal cisterns are patent. Moderate calcific atherosclerosis of the carotid siphons and included vertebral arteries.  No skull fracture. Patient is osteopenic. The included ocular globes and orbital contents are non-suspicious, status post right ocular lens implant. Mild paranasal sinus mucosal thickening without air-fluid levels. The mastoid air cells are well aerated. Patient is edentulous.  CT CERVICAL SPINE FINDINGS  Cervical vertebral bodies and posterior elements are intact and aligned with maintenance of the cervical lordosis. Moderate C5-6 and C6-7 degenerative disc disease, mild to moderate C4-5. C1-2 articulation maintained with moderate to severe arthropathy. Mild calcified pannus about the odontoid process can be seen with CPPD. Bone mineral density is decreased without destructive bony lesions. Moderate calcific atherosclerosis of the carotid bulbs.  Degenerative disc disease and facet arthropathy without canal stenosis. Severe C5-6 and moderate to severe left C4-5 neural foraminal narrowing.  IMPRESSION: CT head:  No acute intracranial process.  Stable appearance of the head: Moderate global brain atrophy with moderate to severe white matter changes suggesting chronic small vessel ischemic  disease.  CT cervical spine:  No acute fracture nor malalignment.  Stable degenerative change of the cervical spine.   Electronically Signed   By: Awilda Metro   On: 05/14/2013 03:00     EKG Interpretation   Date/Time:  Wednesday May 14 2013 03:26:02 EDT Ventricular Rate:  70 PR Interval:  167 QRS Duration: 97 QT Interval:  409 QTC Calculation: 441 R Axis:   -36 Text Interpretation:  Atrial-paced rhythm Left axis deviation Low voltage,  precordial leads Confirmed by Riven Mabile  MD, Charisa Twitty ([id number]) on 05/14/2013  3:47:38 AM     IV narcotics pain control.  4:00 AM discussed with orthopedics on-call, Dr.Duda - will see PT. MED to admit MDM   Diagnosis: Right hip fracture, fall nursing home patient, history of dementia  EKG, labs and imaging reviewed as above. IV narcotics. Orthopedic consult and medical admission  Sunnie Nielsen, MD 05/14/13 [phone]

## 2013-05-14 NOTE — Progress Notes (Signed)
Patient was stable at time of transfer. Reported off to Felicia Quanobert Todd RN in short stay and [facility]. Patient left with her belongings. Patient's son has signed consent for surgery. Patient confused and refused hibiclens for the moment. She also refused TED hose.

## 2013-05-14 NOTE — Consult Note (Signed)
Reason for Consult: Subtrochanteric right hip fracture Referring Physician: Dr Sanjuan Felicia West is an 80 y.o. female.  HPI: Patient is a 80 year old woman with dementia status post mechanical fall with right hip pain  Past Medical History  Diagnosis Date  . Hypertension   . Hyperlipidemia   . SSS (sick sinus syndrome)   . CAD (coronary artery disease)   . Dementia   . MI (myocardial infarction)   . GERD (gastroesophageal reflux disease)   . Diverticulosis   . Osteoporosis     Past Surgical History  Procedure Laterality Date  . Knee surgery    . Pacemaker insertion    . Cardiac catheterization  09/09/02  . Vascular surgery    . Cholecystectomy    . Colectomy      Family History  Problem Relation Age of Onset  . Colon cancer Father   . Stroke Brother   . Pneumonia Mother     Social History:  reports that she has never smoked. She has never used smokeless tobacco. She reports that she does not drink alcohol or use illicit drugs.  Allergies:  Allergies  Allergen Reactions  . Penicillins Other (See Comments)    Pt states she "blacks out"  . Lidocaine     Medications: I have reviewed the patient's current medications.  Results for orders placed during the hospital encounter of 05/14/13 (from the past 48 hour(s))  BASIC METABOLIC PANEL     Status: Abnormal   Collection Time    05/14/13  3:08 AM      Result Value Ref Range   Sodium 137  137 - 147 mEq/L   Potassium 4.0  3.7 - 5.3 mEq/L   Chloride 98  96 - 112 mEq/L   CO2 26  19 - 32 mEq/L   Glucose, Bld 127 (*) 70 - 99 mg/dL   BUN 18  6 - 23 mg/dL   Creatinine, Ser 1.23 (*) 0.50 - 1.10 mg/dL   Calcium 8.6  8.4 - 10.5 mg/dL   GFR calc non Af Amer 36 (*) >90 mL/min   GFR calc Af Amer 42 (*) >90 mL/min   Comment: (NOTE)     The eGFR has been calculated using the CKD EPI equation.     This calculation has not been validated in all clinical situations.     eGFR's persistently <90 mL/min signify possible  Chronic Kidney     Disease.  CBC WITH DIFFERENTIAL     Status: Abnormal   Collection Time    05/14/13  3:08 AM      Result Value Ref Range   WBC 12.1 (*) 4.0 - 10.5 K/uL   RBC 3.51 (*) 3.87 - 5.11 MIL/uL   Hemoglobin 10.3 (*) 12.0 - 15.0 g/dL   HCT 32.2 (*) 36.0 - 46.0 %   MCV 91.7  78.0 - 100.0 fL   MCH 29.3  26.0 - 34.0 pg   MCHC 32.0  30.0 - 36.0 g/dL   RDW 15.2  11.5 - 15.5 %   Platelets 155  150 - 400 K/uL   Neutrophils Relative % 72  43 - 77 %   Neutro Abs 8.7 (*) 1.7 - 7.7 K/uL   Lymphocytes Relative 19  12 - 46 %   Lymphs Abs 2.3  0.7 - 4.0 K/uL   Monocytes Relative 7  3 - 12 %   Monocytes Absolute 0.8  0.1 - 1.0 K/uL   Eosinophils Relative 2  0 - 5 %  Eosinophils Absolute 0.2  0.0 - 0.7 K/uL   Basophils Relative 0  0 - 1 %   Basophils Absolute 0.0  0.0 - 0.1 K/uL  PROTIME-INR     Status: None   Collection Time    05/14/13  3:08 AM      Result Value Ref Range   Prothrombin Time 13.1  11.6 - 15.2 seconds   INR 1.01  0.00 - 1.49  TYPE AND SCREEN     Status: None   Collection Time    05/14/13  3:08 AM      Result Value Ref Range   ABO/RH(D) A POS     Antibody Screen NEG     Sample Expiration 05/17/2013    ABO/RH     Status: None   Collection Time    05/14/13  3:08 AM      Result Value Ref Range   ABO/RH(D) A POS    URINALYSIS, ROUTINE W REFLEX MICROSCOPIC     Status: Abnormal   Collection Time    05/14/13  3:42 AM      Result Value Ref Range   Color, Urine YELLOW  YELLOW   APPearance CLOUDY (*) CLEAR   Specific Gravity, Urine 1.008  1.005 - 1.030   pH 5.5  5.0 - 8.0   Glucose, UA NEGATIVE  NEGATIVE mg/dL   Hgb urine dipstick NEGATIVE  NEGATIVE   Bilirubin Urine NEGATIVE  NEGATIVE   Ketones, ur NEGATIVE  NEGATIVE mg/dL   Protein, ur NEGATIVE  NEGATIVE mg/dL   Urobilinogen, UA 0.2  0.0 - 1.0 mg/dL   Nitrite POSITIVE (*) NEGATIVE   Leukocytes, UA MODERATE (*) NEGATIVE  URINE MICROSCOPIC-ADD ON     Status: Abnormal   Collection Time    05/14/13  3:42 AM       Result Value Ref Range   Squamous Epithelial / LPF RARE  RARE   WBC, UA 7-10  <3 WBC/hpf   Comment: WITH CLUMPS   Bacteria, UA MANY (*) RARE  TROPONIN I     Status: None   Collection Time    05/14/13  5:02 AM      Result Value Ref Range   Troponin I <0.30  <0.30 ng/mL   Comment:            Due to the release kinetics of cTnI,     a negative result within the first hours     of the onset of symptoms does not rule out     myocardial infarction with certainty.     If myocardial infarction is still suspected,     repeat the test at appropriate intervals.    Dg Chest 1 View  05/14/2013   CLINICAL DATA:  Preop hip fracture.  EXAM: CHEST - 1 VIEW  COMPARISON:  DG CHEST 2 VIEW dated 03/12/2013  FINDINGS: Cardiac pacemaker. Mild hyperinflation. No focal airspace disease or consolidation in the lungs. Chronic bronchitic changes. Normal heart size and pulmonary vascularity. No significant change since previous study.  IMPRESSION: No active disease.   Electronically Signed   By: Lucienne Capers M.D.   On: 05/14/2013 02:53   Dg Hip Complete Right  05/14/2013   CLINICAL DATA:  Right hip pain after a fall.  EXAM: RIGHT HIP - COMPLETE 2+ VIEW  COMPARISON:  CT HIP*R* W/O CM dated 11/29/2010; DG HIP COMPLETE*R* dated 11/29/2010  FINDINGS: Comminuted inter trochanteric fractures of the proximal right femur with varus angulation of the fracture fragments. No dislocation of  the hip joint. Pelvis and left hip appear intact. Vascular calcifications.  IMPRESSION: Comminuted inter trochanteric fractures of the proximal right femur.   Electronically Signed   By: Lucienne Capers M.D.   On: 05/14/2013 02:52   Ct Head Wo Contrast  05/14/2013   CLINICAL DATA:  Fall from the head.  EXAM: CT HEAD WITHOUT CONTRAST  CT CERVICAL SPINE WITHOUT CONTRAST  TECHNIQUE: Multidetector CT imaging of the head and cervical spine was performed following the standard protocol without intravenous contrast. Multiplanar CT image  reconstructions of the cervical spine were also generated.  COMPARISON:  CT HEAD W/O CM dated 03/12/2013; CT C SPINE W/O CM dated 03/12/2013  FINDINGS: CT HEAD FINDINGS  The ventricles and sulci are normal for age. No intraparenchymal hemorrhage, mass effect nor midline shift. Patchy to confluent supratentorial white matter hypodensities are within normal range for patient's age and though non-specific suggest sequelae of chronic small vessel ischemic disease. No acute large vascular territory infarcts. Remote small left inferior cerebellar infarct. Multiple remote bilateral basal ganglia lacunar infarcts.  No abnormal extra-axial fluid collections. Basal cisterns are patent. Moderate calcific atherosclerosis of the carotid siphons and included vertebral arteries.  No skull fracture. Patient is osteopenic. The included ocular globes and orbital contents are non-suspicious, status post right ocular lens implant. Mild paranasal sinus mucosal thickening without air-fluid levels. The mastoid air cells are well aerated. Patient is edentulous.  CT CERVICAL SPINE FINDINGS  Cervical vertebral bodies and posterior elements are intact and aligned with maintenance of the cervical lordosis. Moderate C5-6 and C6-7 degenerative disc disease, mild to moderate C4-5. C1-2 articulation maintained with moderate to severe arthropathy. Mild calcified pannus about the odontoid process can be seen with CPPD. Bone mineral density is decreased without destructive bony lesions. Moderate calcific atherosclerosis of the carotid bulbs.  Degenerative disc disease and facet arthropathy without canal stenosis. Severe C5-6 and moderate to severe left C4-5 neural foraminal narrowing.  IMPRESSION: CT head:  No acute intracranial process.  Stable appearance of the head: Moderate global brain atrophy with moderate to severe white matter changes suggesting chronic small vessel ischemic disease.  CT cervical spine:  No acute fracture nor malalignment.   Stable degenerative change of the cervical spine.   Electronically Signed   By: Elon Alas   On: 05/14/2013 03:00   Ct Cervical Spine Wo Contrast  05/14/2013   CLINICAL DATA:  Fall from the head.  EXAM: CT HEAD WITHOUT CONTRAST  CT CERVICAL SPINE WITHOUT CONTRAST  TECHNIQUE: Multidetector CT imaging of the head and cervical spine was performed following the standard protocol without intravenous contrast. Multiplanar CT image reconstructions of the cervical spine were also generated.  COMPARISON:  CT HEAD W/O CM dated 03/12/2013; CT C SPINE W/O CM dated 03/12/2013  FINDINGS: CT HEAD FINDINGS  The ventricles and sulci are normal for age. No intraparenchymal hemorrhage, mass effect nor midline shift. Patchy to confluent supratentorial white matter hypodensities are within normal range for patient's age and though non-specific suggest sequelae of chronic small vessel ischemic disease. No acute large vascular territory infarcts. Remote small left inferior cerebellar infarct. Multiple remote bilateral basal ganglia lacunar infarcts.  No abnormal extra-axial fluid collections. Basal cisterns are patent. Moderate calcific atherosclerosis of the carotid siphons and included vertebral arteries.  No skull fracture. Patient is osteopenic. The included ocular globes and orbital contents are non-suspicious, status post right ocular lens implant. Mild paranasal sinus mucosal thickening without air-fluid levels. The mastoid air cells are well aerated. Patient  is edentulous.  CT CERVICAL SPINE FINDINGS  Cervical vertebral bodies and posterior elements are intact and aligned with maintenance of the cervical lordosis. Moderate C5-6 and C6-7 degenerative disc disease, mild to moderate C4-5. C1-2 articulation maintained with moderate to severe arthropathy. Mild calcified pannus about the odontoid process can be seen with CPPD. Bone mineral density is decreased without destructive bony lesions. Moderate calcific atherosclerosis  of the carotid bulbs.  Degenerative disc disease and facet arthropathy without canal stenosis. Severe C5-6 and moderate to severe left C4-5 neural foraminal narrowing.  IMPRESSION: CT head:  No acute intracranial process.  Stable appearance of the head: Moderate global brain atrophy with moderate to severe white matter changes suggesting chronic small vessel ischemic disease.  CT cervical spine:  No acute fracture nor malalignment.  Stable degenerative change of the cervical spine.   Electronically Signed   By: Elon Alas   On: 05/14/2013 03:00    Review of Systems  All other systems reviewed and are negative.  Blood pressure 131/78, pulse 77, temperature 97.3 F (36.3 C), temperature source Oral, resp. rate 18, height 5' 6.93" (1.7 m), weight 69.1 kg (152 lb 5.4 oz), SpO2 100.00%. Physical Exam On examination patient has a good dorsalis pedis pulse. She has pain with attempted range of motion of the right lower extremity. Radiographs shows subtrochanteric right hip fracture. Assessment/Plan: Assessment: Subtrochanteric right hip fracture.  Plan: We will plan for intramedullary nail fixation for the right hip. Risks and benefits were discussed with the patient's son including infection neurovascular injury failure of fixation mortality need for additional surgery. Patient's son states he understands and wished to proceed at this time.  Newt Minion 05/14/2013, 6:53 AM

## 2013-05-14 NOTE — ED Notes (Addendum)
Per EMS report: pt from [facility]: pt fell this evening at 23:30.  Pt slid attempting to get off the bed.  Pt c/o of pain in her left.  Pt denies LOC and pt is not on blood thinners.  Pt a/o x 4.  EMS did not note any obvious deformity.  Pt received 4mg  of zofran and 100mcg of fentanyl by EMS.

## 2013-05-14 NOTE — Progress Notes (Signed)
Clinical Social Work Department BRIEF PSYCHOSOCIAL ASSESSMENT 05/14/2013  Patient:  Felicia West,Felicia West     Account Number:  [id number]     Admit date:  05/14/2013  Clinical Social Worker:  Jacelyn GripBYRD,SUZANNA, LCSWA  Date/Time:  05/14/2013 09:30 AM  Referred by:  Physician  Date Referred:  05/14/2013 Referred for  SNF Placement   Other Referral:   Interview type:  Family Other interview type:    PSYCHOSOCIAL DATA Living Status:  FACILITY [facility] Level of care:  Skilled Nursing Facility Primary support name:  Morris Balzarini/son Primary support relationship to patient:  CHILD, ADULT Degree of support available:   strong    CURRENT CONCERNS Current Concerns  Post-Acute Placement   Other Concerns:    SOCIAL WORK ASSESSMENT / PLAN CSW received [facility].    CSW visited pt at bedside and pt son present at this time. Pt son reports that pt admitted this morning status post fall and hip fracture. Per pt son, plan is for pt to have surgery this afternoon. Pt son discussed that [facility]. Per pt son, [facility]. Pt son discussed that pt family would like for pt to [facility] upon discharge.    [facility] and notified facility that pt family wishes for pt to return when pt medically stable for discharge.    CSW completed FL2 and [facility].    CSW to continue to follow and facilitate pt discharge needs when pt medically stable for discharge.   Assessment/plan status:  Psychosocial Support/Ongoing Assessment of Needs Other assessment/ plan:   discharge planning   Information/referral to community resources:   [facility].     PATIENT'S/FAMILY'S RESPONSE TO PLAN OF CARE: Per chart, pt alert and oriented x 3. Pt resting comfortably throughout assessment. Pt family appears very supportive and actively involved in pt care. Pt son is eager for pt surgery, but feels that [facility].    Loletta SpecterSuzanna Kidd, MSW, LCSW Clinical Social Work [phone]

## 2013-05-14 NOTE — Progress Notes (Signed)
Dr. Michelle Piperssey and CRNA aware of patient's BP

## 2013-05-14 NOTE — Progress Notes (Addendum)
Patient calmer additional family members have arrived. Dr. Michelle Piperssey notified about BP dropping. Will continue to monitor. Reassessed BP size changed to small adult.

## 2013-05-14 NOTE — Op Note (Signed)
OPERATIVE REPORT  DATE OF SURGERY: 05/14/2013  PATIENT:  Oneida AlarAnne E Giraud,  80 y.o. female  PRE-OPERATIVE DIAGNOSIS:  RIGHT SUB TROCHANTRIC HIP FX  POST-OPERATIVE DIAGNOSIS:  RIGHT SUB TROCHANTRIC HIP FX  PROCEDURE:  Procedure(s): INTRAMEDULLARY (IM) NAIL INTERTROCHANTRIC Biomet nail 11 x 180 mm locked proximally with 100 mm barrel distally 34 mm screw  SURGEON:  Surgeon(s): Nadara MustardMarcus V Nashalie Sallis, MD  ANESTHESIA:   general  EBL:  min ML  SPECIMEN:  No Specimen  TOURNIQUET:  * No tourniquets in log *  PROCEDURE DETAILS: Patient is a 80 year old woman who had a mechanical fall at skilled nursing. Patient was admitted stabilized and presents at this time for internal fixation. Risks and benefits were discussed including infection neurovascular injury pain failure of fixation need for additional surgery. Patient's sons including both power of attorney his stated understand and wish to proceed at this time. Description of procedure patient was brought to the operating room and underwent a general anesthetic. After adequate levels of anesthesia were obtained patient was [facility] supine [facility] the right lower extremity was placed in boot traction the left lower extremity was placed in the dorsal lithotomy position all bony prominences were padded patient was then draped into a sterile field the shower curtain and DuraPrep. A timeout was called. Incision was made just proximal to the greater trochanter. A guidewire was inserted down the greater trochanter this was overdrilled and a intramedullary nail was then inserted down the shaft. The fracture was reduced with the nail. A guidewire was inserted center center in the femoral head this was overdrilled and 100 mm barrel was secured. This was locked proximally. Distal locking screw was placed a 34 mm. C-arm fluoroscopy verified alignment of both AP and lateral planes. The wounds were irrigated normal saline. Subcutaneous is closed using 0  Vicryl skin was closing staples. Mepilex dressing was applied patient was extubated taken to the PACU in stable condition  PLAN OF CARE: Admit to inpatient   PATIENT DISPOSITION:  PACU - hemodynamically stable.   Nadara MustardMarcus V Jazalynn Mireles, MD 05/14/2013 6:04 PM

## 2013-05-15 DIAGNOSIS — D62 Acute posthemorrhagic anemia: Secondary | ICD-10-CM

## 2013-05-15 LAB — BASIC METABOLIC PANEL
BUN: 23 mg/dL (ref 6–23)
CALCIUM: 8 mg/dL — AB (ref 8.4–10.5)
CO2: 24 mEq/L (ref 19–32)
CREATININE: 1.71 mg/dL — AB (ref 0.50–1.10)
Chloride: 106 mEq/L (ref 96–112)
GFR calc Af Amer: 28 mL/min — ABNORMAL LOW (ref >90)
GFR calc non Af Amer: 24 mL/min — ABNORMAL LOW (ref >90)
GLUCOSE: 107 mg/dL — AB (ref 70–99)
Potassium: 4.4 mEq/L (ref 3.7–5.3)
Sodium: 141 mEq/L (ref 137–147)

## 2013-05-15 LAB — GLUCOSE, CAPILLARY
GLUCOSE-CAPILLARY: 104 mg/dL — AB (ref 70–99)
GLUCOSE-CAPILLARY: 112 mg/dL — AB (ref 70–99)
GLUCOSE-CAPILLARY: 106 mg/dL — AB (ref 70–99)
GLUCOSE-CAPILLARY: 96 mg/dL (ref 70–99)
Glucose-Capillary: 99 mg/dL (ref 70–99)

## 2013-05-15 LAB — TYPE AND SCREEN
ABO/RH(D): A POS
Antibody Screen: NEGATIVE
Unit division: 0
Unit division: 0

## 2013-05-15 LAB — CBC
HEMATOCRIT: 27.7 % — AB (ref 36.0–46.0)
HEMOGLOBIN: 9.3 g/dL — AB (ref 12.0–15.0)
MCH: 30.2 pg (ref 26.0–34.0)
MCHC: 33.6 g/dL (ref 30.0–36.0)
MCV: 89.9 fL (ref 78.0–100.0)
Platelets: 93 10*3/uL — ABNORMAL LOW (ref 150–400)
RBC: 3.08 MIL/uL — AB (ref 3.87–5.11)
RDW: 16 % — ABNORMAL HIGH (ref 11.5–15.5)
WBC: 7.9 10*3/uL (ref 4.0–10.5)

## 2013-05-15 LAB — VITAMIN D 25 HYDROXY (VIT D DEFICIENCY, FRACTURES): VIT D 25 HYDROXY: 47 ng/mL (ref 30–89)

## 2013-05-15 MED ORDER — MORPHINE SULFATE 2 MG/ML IJ SOLN
0.5000 mg | Freq: Once | INTRAMUSCULAR | Status: AC
  Administered 2013-05-15: 0.5 mg via INTRAVENOUS
  Filled 2013-05-15: qty 1

## 2013-05-15 MED ORDER — METOPROLOL SUCCINATE 12.5 MG HALF TABLET
12.5000 mg | ORAL_TABLET | Freq: Every day | ORAL | Status: DC
  Filled 2013-05-15 (×3): qty 1

## 2013-05-15 MED ORDER — HALOPERIDOL LACTATE 5 MG/ML IJ SOLN
1.0000 mg | Freq: Four times a day (QID) | INTRAMUSCULAR | Status: DC | PRN
  Administered 2013-05-16: 1 mg via INTRAVENOUS
  Filled 2013-05-15: qty 1

## 2013-05-15 MED ORDER — SODIUM CHLORIDE 0.9 % IV SOLN: INTRAVENOUS | Status: DC

## 2013-05-15 MED ORDER — ENSURE PUDDING PO PUDG
1.0000 | Freq: Three times a day (TID) | ORAL | Status: DC
  Administered 2013-05-15 – 2013-05-17 (×4): 1 via ORAL

## 2013-05-15 NOTE — Clinical Documentation Improvement (Signed)
05/15/13  Please clarify anemia. Thank you.  Possible Clinical Conditions?    Expected Acute Blood Loss Anemia  Acute Blood Loss Anemia  Acute on chronic blood loss anemia  Chronic blood loss anemia  Precipitous drop in Hematocrit  Other Condition________________  Cannot Clinically Determine   Supporting Information: Risk Factors:  Fall resulting Closed right hip fracture S/p INTRAMEDULLARY (IM) NAIL INTERTROCHANTRIC w/ min EBL Advanced age 80  Diagnostics: H&H  4/22 @ 0308 =10.3/32.2 4/22 @ 0634 = 8.9/27.9 4/22 @ 1916 = 6.6/20.7 4/23 @ 0455 = 9.3/27.7  Treatments: Transfusion: 2 units PRBC  H&H monitoring/CBC daily x 3 starting 4/23 VS q4h  Thank You, Harless Littenebora T Westen Dinino ,RN Clinical Documentation Specialist:  [phone]  [facility]- Health Information Management

## 2013-05-15 NOTE — Progress Notes (Signed)
Foley catheter was removed this morning at 0700. Patient has been unable to spontaneously void since catheter removal. Bladder scan was performed at 1730 and recorded 120mL. Dr. Benjamine MolaVann was notified and orders were given to increase IV fluids to 2975mL/hr for 12 hours and encourage fluids. Will continue to monitor.  Patient's family requesting tylenol be given to patient for pain management. With patient's altered mental status, was unable to give 650mg  of PO tylenol. Notified Dr. Benjamine MolaVann and orders were placed to administer a one time dose of 0.5mg  IV morphine. Reassessed patient's pain using faces scale. Patient calm and cooperative, will hold off on giving one time dose of IV morphine for pain until needed. Patient becoming increasingly confused/agitated and refusing staff/family's help. Notified Dr. Benjamine MolaVann and orders were placed to administer 1mg  IV haldol q6 PRN for agitation.

## 2013-05-15 NOTE — Progress Notes (Signed)
Patient ID: Felicia West, female   DOB: Sep 26, 1916, 80 y.o.   MRN: [id number] Patient resting comfortably this morning. Hemoglobin 9.3. Anticipate discharge back to skilled nursing facility.

## 2013-05-15 NOTE — Progress Notes (Addendum)
PROGRESS NOTE  Felicia West RUE:[id number] DOB: 1916-05-04 DOA: 05/14/2013 PCP: Georgann HousekeeperHUSAIN,KARRAR, MD  Assessment/Plan: Right hip fracture status post fall - s/p surgery, PT/OT  Leukocytosis with possible UTI - await urine culture. Patient has been placed on ciprofloxacin.  Hypertension - holding parameters on BB  Mild renal insufficiency - baseline around 2??  Decrease IVF and monitor  Anemia- ABLA - follow CBC. S/p 2 units   Sick sinus syndrome status post pacemaker placement.  Dementia - continue present medications.   Code Status: DNR (gold form on chart) Family Communication: son at bedside Disposition Plan: back to [facility] 4/24 if culture back   Consultants:  Ortho  PT/OT  Procedures: S/p  INTRAMEDULLARY (IM) NAIL INTERTROCHANTRIC   Antibiotics:  cipro  HPI/Subjective: Patient with no current complaints  Objective: Filed Vitals:   05/15/13 0601  BP: 92/46  Pulse: 80  Temp: 98.7 F (37.1 C)  Resp: 16    Intake/Output Summary (Last 24 hours) at 05/15/13 0815 Last data filed at 05/15/13 [id number]  Gross per 24 hour  Intake   1990 ml  Output    240 ml  Net   1750 ml   Filed Weights   05/14/13 0631  Weight: 69.1 kg (152 lb 5.4 oz)    Exam:   General:  Opens eyes  Cardiovascular: rrr  Respiratory: clear, no wheezing, anterior  Abdomen: +BS, soft  Musculoskeletal: no edema  Data Reviewed: Basic Metabolic Panel:  Recent Labs Lab 05/14/13 0308 05/14/13 0634 05/14/13 1055 05/15/13 0455  NA 137 138  --  141  K 4.0 4.7  --  4.4  CL 98 103  --  106  CO2 26 27  --  24  GLUCOSE 127* 141*  --  107*  BUN 18 19  --  23  CREATININE 1.23* 1.30*  --  1.71*  CALCIUM 8.6 8.3*  --  8.0*  MG  --   --  1.9  --    Liver Function Tests:  Recent Labs Lab 05/14/13 0634  AST 21  ALT 9  ALKPHOS 73  BILITOT 0.4  PROT 5.0*  ALBUMIN 2.2*   No results found for this basename: LIPASE, AMYLASE,  in the last 168 hours No results found for this  basename: AMMONIA,  in the last 168 hours CBC:  Recent Labs Lab 05/14/13 0308 05/14/13 0634 05/14/13 1916 05/15/13 0455  WBC 12.1* 12.4*  --  7.9  NEUTROABS 8.7* 10.9*  --   --   HGB 10.3* 8.9* 6.6* 9.3*  HCT 32.2* 27.9* 20.7* 27.7*  MCV 91.7 91.8  --  89.9  PLT 155 138*  --  93*   Cardiac Enzymes:  Recent Labs Lab 05/14/13 0502  TROPONINI <0.30   BNP (last 3 results) No results found for this basename: PROBNP,  in the last 8760 hours CBG:  Recent Labs Lab 05/14/13 0815 05/15/13 0142 05/15/13 0600  GLUCAP 122* 104* 99    No results found for this or any previous visit (from the past 240 hour(s)).   Studies: Dg Chest 1 View  05/14/2013   CLINICAL DATA:  Preop hip fracture.  EXAM: CHEST - 1 VIEW  COMPARISON:  DG CHEST 2 VIEW dated 03/12/2013  FINDINGS: Cardiac pacemaker. Mild hyperinflation. No focal airspace disease or consolidation in the lungs. Chronic bronchitic changes. Normal heart size and pulmonary vascularity. No significant change since previous study.  IMPRESSION: No active disease.   Electronically Signed   By: Burman NievesWilliam  Stevens M.D.   On:  05/14/2013 02:53   Dg Hip Complete Right  05/14/2013   CLINICAL DATA:  Right hip pain after a fall.  EXAM: RIGHT HIP - COMPLETE 2+ VIEW  COMPARISON:  CT HIP*R* W/O CM dated 11/29/2010; DG HIP COMPLETE*R* dated 11/29/2010  FINDINGS: Comminuted inter trochanteric fractures of the proximal right femur with varus angulation of the fracture fragments. No dislocation of the hip joint. Pelvis and left hip appear intact. Vascular calcifications.  IMPRESSION: Comminuted inter trochanteric fractures of the proximal right femur.   Electronically Signed   By: Burman Nieves M.D.   On: 05/14/2013 02:52   Dg Hip Operative Right  05/14/2013   CLINICAL DATA:  Right hip nail placement  EXAM: DG OPERATIVE RIGHT HIP  TECHNIQUE: A single spot fluoroscopic AP image of the right hip is submitted.  COMPARISON:  Plain film from earlier in the same day   FINDINGS: Two spot films were obtained intraoperatively and reveal a medullary rod with proximal fixation screw. The fracture fragments are in near anatomic alignment. 16 seconds of fluoroscopy was utilized.   Electronically Signed   By: Alcide Clever M.D.   On: 05/14/2013 18:34   Ct Head Wo Contrast  05/14/2013   CLINICAL DATA:  Fall from the head.  EXAM: CT HEAD WITHOUT CONTRAST  CT CERVICAL SPINE WITHOUT CONTRAST  TECHNIQUE: Multidetector CT imaging of the head and cervical spine was performed following the standard protocol without intravenous contrast. Multiplanar CT image reconstructions of the cervical spine were also generated.  COMPARISON:  CT HEAD W/O CM dated 03/12/2013; CT C SPINE W/O CM dated 03/12/2013  FINDINGS: CT HEAD FINDINGS  The ventricles and sulci are normal for age. No intraparenchymal hemorrhage, mass effect nor midline shift. Patchy to confluent supratentorial white matter hypodensities are within normal range for patient's age and though non-specific suggest sequelae of chronic small vessel ischemic disease. No acute large vascular territory infarcts. Remote small left inferior cerebellar infarct. Multiple remote bilateral basal ganglia lacunar infarcts.  No abnormal extra-axial fluid collections. Basal cisterns are patent. Moderate calcific atherosclerosis of the carotid siphons and included vertebral arteries.  No skull fracture. Patient is osteopenic. The included ocular globes and orbital contents are non-suspicious, status post right ocular lens implant. Mild paranasal sinus mucosal thickening without air-fluid levels. The mastoid air cells are well aerated. Patient is edentulous.  CT CERVICAL SPINE FINDINGS  Cervical vertebral bodies and posterior elements are intact and aligned with maintenance of the cervical lordosis. Moderate C5-6 and C6-7 degenerative disc disease, mild to moderate C4-5. C1-2 articulation maintained with moderate to severe arthropathy. Mild calcified pannus about  the odontoid process can be seen with CPPD. Bone mineral density is decreased without destructive bony lesions. Moderate calcific atherosclerosis of the carotid bulbs.  Degenerative disc disease and facet arthropathy without canal stenosis. Severe C5-6 and moderate to severe left C4-5 neural foraminal narrowing.  IMPRESSION: CT head:  No acute intracranial process.  Stable appearance of the head: Moderate global brain atrophy with moderate to severe white matter changes suggesting chronic small vessel ischemic disease.  CT cervical spine:  No acute fracture nor malalignment.  Stable degenerative change of the cervical spine.   Electronically Signed   By: Awilda Metro   On: 05/14/2013 03:00   Ct Cervical Spine Wo Contrast  05/14/2013   CLINICAL DATA:  Fall from the head.  EXAM: CT HEAD WITHOUT CONTRAST  CT CERVICAL SPINE WITHOUT CONTRAST  TECHNIQUE: Multidetector CT imaging of the head and cervical spine was performed  following the standard protocol without intravenous contrast. Multiplanar CT image reconstructions of the cervical spine were also generated.  COMPARISON:  CT HEAD W/O CM dated 03/12/2013; CT C SPINE W/O CM dated 03/12/2013  FINDINGS: CT HEAD FINDINGS  The ventricles and sulci are normal for age. No intraparenchymal hemorrhage, mass effect nor midline shift. Patchy to confluent supratentorial white matter hypodensities are within normal range for patient's age and though non-specific suggest sequelae of chronic small vessel ischemic disease. No acute large vascular territory infarcts. Remote small left inferior cerebellar infarct. Multiple remote bilateral basal ganglia lacunar infarcts.  No abnormal extra-axial fluid collections. Basal cisterns are patent. Moderate calcific atherosclerosis of the carotid siphons and included vertebral arteries.  No skull fracture. Patient is osteopenic. The included ocular globes and orbital contents are non-suspicious, status post right ocular lens implant. Mild  paranasal sinus mucosal thickening without air-fluid levels. The mastoid air cells are well aerated. Patient is edentulous.  CT CERVICAL SPINE FINDINGS  Cervical vertebral bodies and posterior elements are intact and aligned with maintenance of the cervical lordosis. Moderate C5-6 and C6-7 degenerative disc disease, mild to moderate C4-5. C1-2 articulation maintained with moderate to severe arthropathy. Mild calcified pannus about the odontoid process can be seen with CPPD. Bone mineral density is decreased without destructive bony lesions. Moderate calcific atherosclerosis of the carotid bulbs.  Degenerative disc disease and facet arthropathy without canal stenosis. Severe C5-6 and moderate to severe left C4-5 neural foraminal narrowing.  IMPRESSION: CT head:  No acute intracranial process.  Stable appearance of the head: Moderate global brain atrophy with moderate to severe white matter changes suggesting chronic small vessel ischemic disease.  CT cervical spine:  No acute fracture nor malalignment.  Stable degenerative change of the cervical spine.   Electronically Signed   By: Awilda Metroourtnay  Bloomer   On: 05/14/2013 03:00    Scheduled Meds: . aspirin EC  325 mg Oral Q breakfast  . ciprofloxacin  400 mg Intravenous QAC breakfast  . escitalopram  20 mg Oral Daily  . irbesartan  150 mg Oral Daily  . metoprolol succinate  12.5 mg Oral Daily  . mirtazapine  7.5 mg Oral QHS  . QUEtiapine  25 mg Oral QHS   Continuous Infusions: . lactated ringers     Antibiotics Given (last 72 hours)   Date/Time Action Medication Dose Rate   05/14/13 1002 Given   ciprofloxacin (CIPRO) IVPB 400 mg 400 mg 200 mL/hr   05/14/13 1732 Given   clindamycin (CLEOCIN) IVPB 900 mg 900 mg    05/15/13 [id number] Given   clindamycin (CLEOCIN) IVPB 600 mg 600 mg 100 mL/hr   05/15/13 [id number] Given   clindamycin (CLEOCIN) IVPB 600 mg 600 mg 100 mL/hr      Principal Problem:   Closed right hip fracture Active Problems:   Encephalopathy  acute   SSS (sick sinus syndrome)   CAD (coronary artery disease)   Dementia   Anemia   Hip fracture   UTI (urinary tract infection)   Leukocytosis, unspecified   Malnutrition of moderate degree    Time spent: 25 min    Joseph ArtJessica U Vann  [facility] Pager [phone]. If 7PM-7AM, please contact night-coverage at [email or web address], password [email or web address] 05/15/2013, 8:15 AM  LOS: 1 day

## 2013-05-15 NOTE — Progress Notes (Signed)
Orthopedic Tech Progress Note Patient Details:  Oneida Alarnne E Pumphrey 09-16-1916 [id number] Unable to use overhead frame Patient ID: Oneida Alarnne E Mcginley, female   DOB: 09-16-1916, 80 y.o.   MRN: [id number]   Jennye Moccasinnthony Craig Roben Tatsch 05/15/2013, 3:40 PM

## 2013-05-15 NOTE — Evaluation (Signed)
Physical Therapy Evaluation Patient Details Name: Felicia West MRN: [id number] DOB: Aug 05, 1916 Today's Date: 05/15/2013   History of Present Illness  pt presents post fall with R Hip fx s/p IM nail.    Clinical Impression  Pt very painful with all mobility despite being premedicated.  Son present and insisting that pt must work through the pain despite her calling out in pain.  Attempted to speak with son about need to work within pt's pain tolerance, however son very insistent on continuing to push pt.  Pt only able to tolerate sitting EOB and requested to return to supine multiple times.  Only performed bed exercises after returning to supine as pt requesting to stay in bed and too painful to attempt OOB to chair at this time.  Will continue to follow.      Follow Up Recommendations SNF    Equipment Recommendations  None recommended by PT    Recommendations for Other Services       Precautions / Restrictions Precautions Precautions: Fall Restrictions Weight Bearing Restrictions: Yes RLE Weight Bearing: Weight bearing as tolerated      Mobility  Bed Mobility Overal bed mobility: Needs Assistance;+2 for physical assistance Bed Mobility: Supine to Sit;Sit to Supine     Supine to sit: Max assist;+2 for physical assistance Sit to supine: Max assist;+2 for physical assistance   General bed mobility comments: pt attempts to A with mobility, however very painful in R hip throughout mobility.    Transfers                    Ambulation/Gait                Stairs            Wheelchair Mobility    Modified Rankin (Stroke Patients Only)       Balance                                             Pertinent Vitals/Pain Pt calls out and grabs R LE with all movement.  Premedicated.  RN made aware.      Home Living Family/patient expects to be discharged to:: Skilled nursing facility                 Additional Comments: pt  from [facility]    Prior Function Level of Independence: Needs assistance   Gait / Transfers Assistance Needed: Uses RW with staff S.    ADL's / Homemaking Assistance Needed: Per son staff A with ADLs.          Hand Dominance        Extremity/Trunk Assessment   Upper Extremity Assessment: Defer to OT evaluation           Lower Extremity Assessment: Generalized weakness;RLE deficits/detail RLE Deficits / Details: pt very painful limited ROM and strength.      Cervical / Trunk Assessment: Kyphotic  Communication   Communication: No difficulties  Cognition Arousal/Alertness: Awake/alert Behavior During Therapy: WFL for tasks assessed/performed Overall Cognitive Status: History of cognitive impairments - at baseline                      General Comments      Exercises General Exercises - Lower Extremity Long Arc Quad: AROM;Both;5 reps Heel Slides: AAROM;Right;10 reps Hip ABduction/ADduction: AAROM;Right;10 reps Straight Leg Raises:  AAROM;Right;10 reps      Assessment/Plan    PT Assessment Patient needs continued PT services  PT Diagnosis Difficulty walking   PT Problem List Decreased strength;Decreased activity tolerance;Decreased balance;Decreased mobility;Decreased cognition;Decreased knowledge of use of DME;Pain  PT Treatment Interventions DME instruction;Gait training;Functional mobility training;Therapeutic activities;Therapeutic exercise;Balance training;Patient/family education   PT Goals (Current goals can be found in the Care Plan section) Acute Rehab PT Goals Patient Stated Goal: None stated PT Goal Formulation: With patient/family Time For Goal Achievement: 05/29/13 Potential to Achieve Goals: Fair    Frequency Min 3X/week   Barriers to discharge        Co-evaluation               End of Session Equipment Utilized During Treatment: Oxygen Activity Tolerance: Patient limited by pain Patient left: in bed;with call bell/phone  within reach;with family/visitor present Nurse Communication: Mobility status         Time: 1007-1030 PT Time Calculation (min): 23 min   Charges:   PT Evaluation $Initial PT Evaluation Tier I: 1 Procedure PT Treatments $Therapeutic Activity: 8-22 mins   PT G CodesSunny Schlein:          Britlee Skolnik F Maxamilian Amadon, [place] [phone] 05/15/2013, 10:44 AM

## 2013-05-15 NOTE — Progress Notes (Signed)
NUTRITION FOLLOW-UP  DOCUMENTATION CODES Per approved criteria  -Moderate (non-severe) malnutrition in context of chronic illness   Pt meets criteria for moderate MALNUTRITION in the context of chronic illness as evidenced by 5% weight loss in one month, PO intake likely, <75% for > one month.   INTERVENTION: -Discussed high kcal/protein snack options-will add nourishments TID w/diet advancement  -Recommend Ensure Pudding po TID, each supplement provides 170 kcal and 4 grams of protein   NUTRITION DIAGNOSIS: Inadequate oral intake related to illness/dementia as evidenced by PO intake <75%, 10 lb unintentional wt loss.   Goal: Pt to meet >/= 90% of their estimated nutrition needs    Monitor:  Diet order, total protein/energy intake, labs, weights  ASSESSMENT: Felicia West is a 80 y.o. female with history of dementia, hypertension, sick sinus syndrome status post pacemaker placement had a fall while walking to the bathroom. Patient was found on the floor by patient's nurse and was brought to the ER and x-rays show right hip fracture  -Pt with dementia resides at SNF, poor intake of food/beverages at SNF.  Pt working with therapy at this time.   Height: Ht Readings from Last 1 Encounters:  05/14/13 5' 6.93" (1.7 m)    Weight: Wt Readings from Last 1 Encounters:  05/14/13 152 lb 5.4 oz (69.1 kg)    BMI:  Body mass index is 23.91 kg/(m^2).  Estimated Nutritional Needs: Kcal: 1700-1900  Protein: 70-80 gram Fluid: >/=1700 ml/daily  Skin: WDL, no edema  Diet Order: General Meal Completion: 25%   Intake/Output Summary (Last 24 hours) at 05/15/13 1138 Last data filed at 05/15/13 0649  Gross per 24 hour  Intake   1990 ml  Output    240 ml  Net   1750 ml    Last BM: pta   Labs:   Recent Labs Lab 05/14/13 0308 05/14/13 0634 05/14/13 1055 05/15/13 0455  NA 137 138  --  141  K 4.0 4.7  --  4.4  CL 98 103  --  106  CO2 26 27  --  24  BUN 18 19  --  23   CREATININE 1.23* 1.30*  --  1.71*  CALCIUM 8.6 8.3*  --  8.0*  MG  --   --  1.9  --   GLUCOSE 127* 141*  --  107*    CBG (last 3)   Recent Labs  05/14/13 0815 05/15/13 0142 05/15/13 0600  GLUCAP 122* 104* 99    Scheduled Meds: . aspirin EC  325 mg Oral Q breakfast  . ciprofloxacin  400 mg Intravenous QAC breakfast  . escitalopram  20 mg Oral Daily  . feeding supplement (ENSURE)  1 Container Oral TID BM  . irbesartan  150 mg Oral Daily  . metoprolol succinate  12.5 mg Oral Daily  . mirtazapine  7.5 mg Oral QHS  . QUEtiapine  25 mg Oral QHS    Continuous Infusions: . lactated ringers     Kendell BaneHeather Devaris Quirk RD, LDN, CNSC [phone] Pager [phone] After Hours Pager

## 2013-05-16 DIAGNOSIS — D62 Acute posthemorrhagic anemia: Secondary | ICD-10-CM

## 2013-05-16 DIAGNOSIS — D72829 Elevated white blood cell count, unspecified: Secondary | ICD-10-CM

## 2013-05-16 LAB — GLUCOSE, CAPILLARY
GLUCOSE-CAPILLARY: 119 mg/dL — AB (ref 70–99)
GLUCOSE-CAPILLARY: 122 mg/dL — AB (ref 70–99)
GLUCOSE-CAPILLARY: 130 mg/dL — AB (ref 70–99)
Glucose-Capillary: 101 mg/dL — ABNORMAL HIGH (ref 70–99)
Glucose-Capillary: 107 mg/dL — ABNORMAL HIGH (ref 70–99)

## 2013-05-16 LAB — BASIC METABOLIC PANEL
BUN: 27 mg/dL — ABNORMAL HIGH (ref 6–23)
CALCIUM: 7.8 mg/dL — AB (ref 8.4–10.5)
CHLORIDE: 104 meq/L (ref 96–112)
CO2: 21 meq/L (ref 19–32)
Creatinine, Ser: 1.96 mg/dL — ABNORMAL HIGH (ref 0.50–1.10)
GFR calc Af Amer: 24 mL/min — ABNORMAL LOW (ref >90)
GFR calc non Af Amer: 21 mL/min — ABNORMAL LOW (ref >90)
GLUCOSE: 130 mg/dL — AB (ref 70–99)
POTASSIUM: 4.1 meq/L (ref 3.7–5.3)
SODIUM: 136 meq/L — AB (ref 137–147)

## 2013-05-16 LAB — CBC
HCT: 24.9 % — ABNORMAL LOW (ref 36.0–46.0)
HEMOGLOBIN: 8.2 g/dL — AB (ref 12.0–15.0)
MCH: 30 pg (ref 26.0–34.0)
MCHC: 32.9 g/dL (ref 30.0–36.0)
MCV: 91.2 fL (ref 78.0–100.0)
PLATELETS: 74 10*3/uL — AB (ref 150–400)
RBC: 2.73 MIL/uL — ABNORMAL LOW (ref 3.87–5.11)
RDW: 16.3 % — ABNORMAL HIGH (ref 11.5–15.5)
WBC: 8 10*3/uL (ref 4.0–10.5)

## 2013-05-16 MED ORDER — ENSURE PUDDING PO PUDG: 1.0000 | Freq: Three times a day (TID) | ORAL | Status: DC

## 2013-05-16 MED ORDER — QUETIAPINE FUMARATE 25 MG PO TABS
25.0000 mg | ORAL_TABLET | Freq: Every day | ORAL | Status: DC
Start: 2013-05-16 — End: 2013-07-22

## 2013-05-16 MED ORDER — QUETIAPINE FUMARATE 25 MG PO TABS
25.0000 mg | ORAL_TABLET | Freq: Every day | ORAL | Status: DC
  Administered 2013-05-17: 25 mg via ORAL
  Filled 2013-05-16 (×2): qty 1

## 2013-05-16 NOTE — Progress Notes (Signed)
PROGRESS NOTE  Felicia West ZOX:[id number] DOB: February 19, 1916 DOA: 05/14/2013 PCP: Georgann HousekeeperHUSAIN,KARRAR, MD  Assessment/Plan: Right hip fracture status post fall - s/p surgery, PT/OT  Leukocytosis with possible UTI - await urine culture. Patient has been placed on ciprofloxacin. -gram negative rods  Hypertension - holding parameters on BB  Mild renal insufficiency - baseline around 2??  Decrease IVF and monitor -encourage PO intake  Anemia- ABLA - follow CBC. S/p 2 units   Sick sinus syndrome status post pacemaker placement.  Dementia - continue present medications.  Acute urinary retention- foley- will d/c with this and plan for voiding trial at SNF  Code Status: DNR (gold form on chart) Family Communication: son at bedside Disposition Plan[facility]   Consultants:  Ortho  PT/OT  Procedures: S/p  INTRAMEDULLARY (IM) NAIL INTERTROCHANTRIC   Antibiotics:  cipro  HPI/Subjective: Patient with no current complaints  Objective: Filed Vitals:   05/16/13 0711  BP: 101/47  Pulse: 70  Temp: 97.6 F (36.4 C)  Resp: 18    Intake/Output Summary (Last 24 hours) at 05/16/13 1303 Last data filed at 05/16/13 0658  Gross per 24 hour  Intake 1121.25 ml  Output    300 ml  Net 821.25 ml   Filed Weights   05/14/13 0631  Weight: 69.1 kg (152 lb 5.4 oz)    Exam:   General:  Opens eyes  Cardiovascular: rrr  Respiratory: clear, no wheezing, anterior  Abdomen: +BS, soft  Musculoskeletal: no edema  Data Reviewed: Basic Metabolic Panel:  Recent Labs Lab 05/14/13 0308 05/14/13 0634 05/14/13 1055 05/15/13 0455 05/16/13 0410  NA 137 138  --  141 136*  K 4.0 4.7  --  4.4 4.1  CL 98 103  --  106 104  CO2 26 27  --  24 21  GLUCOSE 127* 141*  --  107* 130*  BUN 18 19  --  23 27*  CREATININE 1.23* 1.30*  --  1.71* 1.96*  CALCIUM 8.6 8.3*  --  8.0* 7.8*  MG  --   --  1.9  --   --    Liver Function Tests:  Recent Labs Lab 05/14/13 0634  AST  21  ALT 9  ALKPHOS 73  BILITOT 0.4  PROT 5.0*  ALBUMIN 2.2*   No results found for this basename: LIPASE, AMYLASE,  in the last 168 hours No results found for this basename: AMMONIA,  in the last 168 hours CBC:  Recent Labs Lab 05/14/13 0308 05/14/13 0634 05/14/13 1916 05/15/13 0455 05/16/13 0410  WBC 12.1* 12.4*  --  7.9 8.0  NEUTROABS 8.7* 10.9*  --   --   --   HGB 10.3* 8.9* 6.6* 9.3* 8.2*  HCT 32.2* 27.9* 20.7* 27.7* 24.9*  MCV 91.7 91.8  --  89.9 91.2  PLT 155 138*  --  93* 74*   Cardiac Enzymes:  Recent Labs Lab 05/14/13 0502  TROPONINI <0.30   BNP (last 3 results) No results found for this basename: PROBNP,  in the last 8760 hours CBG:  Recent Labs Lab 05/15/13 1644 05/15/13 2127 05/16/13 0041 05/16/13 0418 05/16/13 1106  GLUCAP 106* 96 122* 130* 119*    Recent Results (from the past 240 hour(s))  URINE CULTURE     Status: None   Collection Time    05/14/13 12:35 PM      Result Value Ref Range Status   Specimen Description URINE, CATHETERIZED   Final   Special Requests NONE  Final   Culture  Setup Time     Final   Value: 05/14/2013 14:39     Performed at [facility]   Colony Count     Final   Value: 70,000 COLONIES/ML     Performed at [facility]   Culture     Final   Value: GRAM NEGATIVE RODS     Performed at [facility]   Report Status PENDING   Incomplete     Studies: Dg Hip Operative Right  05/14/2013   CLINICAL DATA:  Right hip nail placement  EXAM: DG OPERATIVE RIGHT HIP  TECHNIQUE: A single spot fluoroscopic AP image of the right hip is submitted.  COMPARISON:  Plain film from earlier in the same day  FINDINGS: Two spot films were obtained intraoperatively and reveal a medullary rod with proximal fixation screw. The fracture fragments are in near anatomic alignment. 16 seconds of fluoroscopy was utilized.   Electronically Signed   By: Alcide CleverMark  Lukens M.D.   On: 05/14/2013 18:34    Scheduled Meds: . aspirin  EC  325 mg Oral Q breakfast  . ciprofloxacin  400 mg Intravenous QAC breakfast  . escitalopram  20 mg Oral Daily  . feeding supplement (ENSURE)  1 Container Oral TID BM  . irbesartan  150 mg Oral Daily  . metoprolol succinate  12.5 mg Oral Daily  . mirtazapine  7.5 mg Oral QHS  . QUEtiapine  25 mg Oral QHS   Continuous Infusions: . sodium chloride 20 mL/hr at 05/16/13 0658  . lactated ringers     Antibiotics Given (last 72 hours)   Date/Time Action Medication Dose Rate   05/14/13 1002 Given   ciprofloxacin (CIPRO) IVPB 400 mg 400 mg 200 mL/hr   05/14/13 1732 Given   clindamycin (CLEOCIN) IVPB 900 mg 900 mg    05/15/13 [id number] Given   clindamycin (CLEOCIN) IVPB 600 mg 600 mg 100 mL/hr   05/15/13 [id number] Given   clindamycin (CLEOCIN) IVPB 600 mg 600 mg 100 mL/hr   05/15/13 [id number] Given   ciprofloxacin (CIPRO) IVPB 400 mg 400 mg 200 mL/hr   05/16/13 1006 Given   ciprofloxacin (CIPRO) IVPB 400 mg 400 mg 200 mL/hr      Principal Problem:   Closed right hip fracture Active Problems:   Encephalopathy acute   SSS (sick sinus syndrome)   CAD (coronary artery disease)   Dementia   Hip fracture   UTI (urinary tract infection)   Leukocytosis, unspecified   Malnutrition of moderate degree   Acute blood loss anemia    Time spent: 25 min    Joseph ArtJessica U Deven Audi  [facility] Pager [phone]. If 7PM-7AM, please contact night-coverage at [email or web address], password [email or web address] 05/16/2013, 1:03 PM  LOS: 2 days

## 2013-05-16 NOTE — Anesthesia Postprocedure Evaluation (Signed)
Anesthesia Post Note  Patient: Felicia West  Procedure(s) Performed: Procedure(s) (LRB): INTRAMEDULLARY (IM) NAIL INTERTROCHANTRIC (Right)  Anesthesia type: general  Patient location: PACU  Post pain: Pain level controlled  Post assessment: Patient's Cardiovascular Status Stable  Last Vitals:  Filed Vitals:   05/16/13 0711  BP: 101/47  Pulse: 70  Temp: 36.4 C  Resp: 18    Post vital signs: Reviewed and stable  Level of consciousness: sedated  Complications: No apparent anesthesia complications

## 2013-05-16 NOTE — Progress Notes (Signed)
OT Cancellation Note  Patient Details Name: Felicia West MRN: [id number] DOB: Jul 01, 1916   Cancelled Treatment:    Reason Eval/Treat Not Completed: Other (comment) Pt is Medicare and current D/C plan is SNF. No apparent immediate acute care OT needs, therefore will defer OT to SNF. Per chart, pt from SNF and returning to SNF. If OT eval is needed please call Acute Rehab Dept. at [phone] or text page OT at [phone].   Rae LipsLeeann M Amoria Mclees [phone] 05/16/2013, 2:20 PM

## 2013-05-16 NOTE — Discharge Summary (Addendum)
Physician Discharge Summary  CATHEY FREDENBURG ZOX:[id number] DOB: [birth year] DOA: 05/14/2013  PCP: Georgann Housekeeper, MD  Admit date: 05/14/2013 Discharge date: 05/16/2013  Time spent: 35 minutes  Recommendations for Outpatient Follow-up:  1. Holding BP meds, may need resumption during rehab 2. Encourage PO intake and fluids- if continues to be failure to thrive, may need palliative care consult-spoke at length with son, Hessie Diener about overall poor prognosis 3. D/c with foley- will need voiding trial at SNF 4. Wean off O2 as tolerated 5. Final urine culture- treated with IV cipro- final results still not back 6. Cbc, bmp 1 week  Discharge Diagnoses:  Principal Problem:   Closed right hip fracture Active Problems:   Encephalopathy acute   SSS (sick sinus syndrome)   CAD (coronary artery disease)   Dementia   Hip fracture   UTI (urinary tract infection)   Leukocytosis, unspecified   Malnutrition of moderate degree   Acute blood loss anemia   Discharge Condition: improved  Diet recommendation: regular  Filed Weights   05/14/13 0631  Weight: 69.1 kg (152 lb 5.4 oz)    History of present illness:  Felicia West is a 80 y.o. female with history of dementia, hypertension, sick sinus syndrome status post pacemaker placement had a fall while walking to the bathroom. Patient was found on the floor by patient's nurse and was brought to the ER and x-rays show right hip fracture. Patient is demented and does not contribute to the history. Patient otherwise denies any chest pain shortness of breath abdominal pain. CT head and neck done in the ER does not show anything acute. Labs to reveal mild leukocytosis with UA showing possible UTI. I have tried to reach patient's son but was able to reach patient's daughter-in-law.      Hospital Course:  Right hip fracture status post fall - s/p surgery, PT/OT - SNF  Leukocytosis with possible UTI - await urine culture. Patient has been placed on  ciprofloxacin.  -gram negative rods   Hypertension - holding meds- may need resumption  Mild renal insufficiency - baseline around 2 per our records   -encourage PO intake   Anemia- ABLA - follow CBC. S/p 2 units   Sick sinus syndrome status post pacemaker placement.   Dementia - continue present medications.   Acute urinary retention- foley- will d/c with this and plan for voiding trial at SNF   Procedures: S/p INTRAMEDULLARY (IM) NAIL INTERTROCHANTRIC   Consultations:  ortho  Discharge Exam: Filed Vitals:   05/16/13 0711  BP: 101/47  Pulse: 70  Temp: 97.6 F (36.4 C)  Resp: 18    General: pleasant- speech difficult to understand Cardiovascular: rrr Respiratory: clear  Discharge Instructions You were cared for by a hospitalist during your hospital stay. If you have any questions about your discharge medications or the care you received while you were in the hospital after you are discharged, you can call the unit and asked to speak with the hospitalist on call if the hospitalist that took care of you is not available. Once you are discharged, your primary care physician will handle any further medical issues. Please note that NO REFILLS for any discharge medications will be authorized once you are discharged, as it is imperative that you return to your primary care physician (or establish a relationship with a primary care physician if you do not have one) for your aftercare needs so that they can reassess your need for medications and monitor your lab values.  Discharge Orders   Future Orders Complete By Expires   Diet - low sodium heart healthy  As directed    Discharge instructions  As directed    Increase activity slowly  As directed    Weight bearing as tolerated  As directed    Questions:     Laterality:  right   Extremity:  Lower       Medication List    STOP taking these medications       acetaminophen 650 MG CR tablet  Commonly known as:   TYLENOL  Replaced by:  acetaminophen 500 MG tablet     irbesartan 150 MG tablet  Commonly known as:  AVAPRO     metoprolol succinate 25 MG 24 hr tablet  Commonly known as:  TOPROL-XL     PRESCRIPTION MEDICATION      TAKE these medications       acetaminophen 500 MG tablet  Commonly known as:  TYLENOL  Take 1 tablet (500 mg total) by mouth every 6 (six) hours as needed for mild pain.     aspirin EC 325 MG tablet  Take 1 tablet (325 mg total) by mouth daily.     cholecalciferol 1000 UNITS tablet  Commonly known as:  VITAMIN D  Take 1,000 Units by mouth daily.     escitalopram 20 MG tablet  Commonly known as:  LEXAPRO  Take 20 mg by mouth daily.     feeding supplement (ENSURE) Pudg  Take 1 Container by mouth 3 (three) times daily between meals.     mirtazapine 15 MG tablet  Commonly known as:  REMERON  Take 7.5 mg by mouth at bedtime.     multivitamin with minerals Tabs tablet  Take 1 tablet by mouth daily.     QUEtiapine 25 MG tablet  Commonly known as:  SEROQUEL  Take 1 tablet (25 mg total) by mouth at bedtime.     vitamin E 400 UNIT capsule  Take 400 Units by mouth daily.       Allergies  Allergen Reactions  . Penicillins Other (See Comments)    Pt states she "blacks out"  . Lidocaine    Follow-up Information   Follow up with DUDA,MARCUS V, MD In 2 weeks.   Specialty:  Orthopedic Surgery   Contact information:   [address] [place] [address] [phone]        The results of significant diagnostics from this hospitalization (including imaging, microbiology, ancillary and laboratory) are listed below for reference.    Significant Diagnostic Studies: Dg Chest 1 View  05/14/2013   CLINICAL DATA:  Preop hip fracture.  EXAM: CHEST - 1 VIEW  COMPARISON:  DG CHEST 2 VIEW dated 03/12/2013  FINDINGS: Cardiac pacemaker. Mild hyperinflation. No focal airspace disease or consolidation in the lungs. Chronic bronchitic changes. Normal heart size and  pulmonary vascularity. No significant change since previous study.  IMPRESSION: No active disease.   Electronically Signed   By: Burman NievesWilliam  Stevens M.D.   On: 05/14/2013 02:53   Dg Hip Complete Right  05/14/2013   CLINICAL DATA:  Right hip pain after a fall.  EXAM: RIGHT HIP - COMPLETE 2+ VIEW  COMPARISON:  CT HIP*R* W/O CM dated 11/29/2010; DG HIP COMPLETE*R* dated 11/29/2010  FINDINGS: Comminuted inter trochanteric fractures of the proximal right femur with varus angulation of the fracture fragments. No dislocation of the hip joint. Pelvis and left hip appear intact. Vascular calcifications.  IMPRESSION: Comminuted inter trochanteric fractures of  the proximal right femur.   Electronically Signed   By: Burman Nieves M.D.   On: 05/14/2013 02:52   Dg Hip Operative Right  05/14/2013   CLINICAL DATA:  Right hip nail placement  EXAM: DG OPERATIVE RIGHT HIP  TECHNIQUE: A single spot fluoroscopic AP image of the right hip is submitted.  COMPARISON:  Plain film from earlier in the same day  FINDINGS: Two spot films were obtained intraoperatively and reveal a medullary rod with proximal fixation screw. The fracture fragments are in near anatomic alignment. 16 seconds of fluoroscopy was utilized.   Electronically Signed   By: Alcide Clever M.D.   On: 05/14/2013 18:34   Ct Head Wo Contrast  05/14/2013   CLINICAL DATA:  Fall from the head.  EXAM: CT HEAD WITHOUT CONTRAST  CT CERVICAL SPINE WITHOUT CONTRAST  TECHNIQUE: Multidetector CT imaging of the head and cervical spine was performed following the standard protocol without intravenous contrast. Multiplanar CT image reconstructions of the cervical spine were also generated.  COMPARISON:  CT HEAD W/O CM dated 03/12/2013; CT C SPINE W/O CM dated 03/12/2013  FINDINGS: CT HEAD FINDINGS  The ventricles and sulci are normal for age. No intraparenchymal hemorrhage, mass effect nor midline shift. Patchy to confluent supratentorial white matter hypodensities are within normal  range for patient's age and though non-specific suggest sequelae of chronic small vessel ischemic disease. No acute large vascular territory infarcts. Remote small left inferior cerebellar infarct. Multiple remote bilateral basal ganglia lacunar infarcts.  No abnormal extra-axial fluid collections. Basal cisterns are patent. Moderate calcific atherosclerosis of the carotid siphons and included vertebral arteries.  No skull fracture. Patient is osteopenic. The included ocular globes and orbital contents are non-suspicious, status post right ocular lens implant. Mild paranasal sinus mucosal thickening without air-fluid levels. The mastoid air cells are well aerated. Patient is edentulous.  CT CERVICAL SPINE FINDINGS  Cervical vertebral bodies and posterior elements are intact and aligned with maintenance of the cervical lordosis. Moderate C5-6 and C6-7 degenerative disc disease, mild to moderate C4-5. C1-2 articulation maintained with moderate to severe arthropathy. Mild calcified pannus about the odontoid process can be seen with CPPD. Bone mineral density is decreased without destructive bony lesions. Moderate calcific atherosclerosis of the carotid bulbs.  Degenerative disc disease and facet arthropathy without canal stenosis. Severe C5-6 and moderate to severe left C4-5 neural foraminal narrowing.  IMPRESSION: CT head:  No acute intracranial process.  Stable appearance of the head: Moderate global brain atrophy with moderate to severe white matter changes suggesting chronic small vessel ischemic disease.  CT cervical spine:  No acute fracture nor malalignment.  Stable degenerative change of the cervical spine.   Electronically Signed   By: Awilda Metro   On: 05/14/2013 03:00   Ct Cervical Spine Wo Contrast  05/14/2013   CLINICAL DATA:  Fall from the head.  EXAM: CT HEAD WITHOUT CONTRAST  CT CERVICAL SPINE WITHOUT CONTRAST  TECHNIQUE: Multidetector CT imaging of the head and cervical spine was performed  following the standard protocol without intravenous contrast. Multiplanar CT image reconstructions of the cervical spine were also generated.  COMPARISON:  CT HEAD W/O CM dated 03/12/2013; CT C SPINE W/O CM dated 03/12/2013  FINDINGS: CT HEAD FINDINGS  The ventricles and sulci are normal for age. No intraparenchymal hemorrhage, mass effect nor midline shift. Patchy to confluent supratentorial white matter hypodensities are within normal range for patient's age and though non-specific suggest sequelae of chronic small vessel ischemic disease. No  acute large vascular territory infarcts. Remote small left inferior cerebellar infarct. Multiple remote bilateral basal ganglia lacunar infarcts.  No abnormal extra-axial fluid collections. Basal cisterns are patent. Moderate calcific atherosclerosis of the carotid siphons and included vertebral arteries.  No skull fracture. Patient is osteopenic. The included ocular globes and orbital contents are non-suspicious, status post right ocular lens implant. Mild paranasal sinus mucosal thickening without air-fluid levels. The mastoid air cells are well aerated. Patient is edentulous.  CT CERVICAL SPINE FINDINGS  Cervical vertebral bodies and posterior elements are intact and aligned with maintenance of the cervical lordosis. Moderate C5-6 and C6-7 degenerative disc disease, mild to moderate C4-5. C1-2 articulation maintained with moderate to severe arthropathy. Mild calcified pannus about the odontoid process can be seen with CPPD. Bone mineral density is decreased without destructive bony lesions. Moderate calcific atherosclerosis of the carotid bulbs.  Degenerative disc disease and facet arthropathy without canal stenosis. Severe C5-6 and moderate to severe left C4-5 neural foraminal narrowing.  IMPRESSION: CT head:  No acute intracranial process.  Stable appearance of the head: Moderate global brain atrophy with moderate to severe white matter changes suggesting chronic small  vessel ischemic disease.  CT cervical spine:  No acute fracture nor malalignment.  Stable degenerative change of the cervical spine.   Electronically Signed   By: Awilda Metroourtnay  Bloomer   On: 05/14/2013 03:00    Microbiology: Recent Results (from the past 240 hour(s))  URINE CULTURE     Status: None   Collection Time    05/14/13 12:35 PM      Result Value Ref Range Status   Specimen Description URINE, CATHETERIZED   Final   Special Requests NONE   Final   Culture  Setup Time     Final   Value: 05/14/2013 14:39     Performed at [facility]   Colony Count     Final   Value: 70,000 COLONIES/ML     Performed at [facility]   Culture     Final   Value: GRAM NEGATIVE RODS     Performed at [facility]   Report Status PENDING   Incomplete     Labs: Basic Metabolic Panel:  Recent Labs Lab 05/14/13 0308 05/14/13 0634 05/14/13 1055 05/15/13 0455 05/16/13 0410  NA 137 138  --  141 136*  K 4.0 4.7  --  4.4 4.1  CL 98 103  --  106 104  CO2 26 27  --  24 21  GLUCOSE 127* 141*  --  107* 130*  BUN 18 19  --  23 27*  CREATININE 1.23* 1.30*  --  1.71* 1.96*  CALCIUM 8.6 8.3*  --  8.0* 7.8*  MG  --   --  1.9  --   --    Liver Function Tests:  Recent Labs Lab 05/14/13 0634  AST 21  ALT 9  ALKPHOS 73  BILITOT 0.4  PROT 5.0*  ALBUMIN 2.2*   No results found for this basename: LIPASE, AMYLASE,  in the last 168 hours No results found for this basename: AMMONIA,  in the last 168 hours CBC:  Recent Labs Lab 05/14/13 0308 05/14/13 0634 05/14/13 1916 05/15/13 0455 05/16/13 0410  WBC 12.1* 12.4*  --  7.9 8.0  NEUTROABS 8.7* 10.9*  --   --   --   HGB 10.3* 8.9* 6.6* 9.3* 8.2*  HCT 32.2* 27.9* 20.7* 27.7* 24.9*  MCV 91.7 91.8  --  89.9 91.2  PLT  155 138*  --  93* 74*   Cardiac Enzymes:  Recent Labs Lab 05/14/13 0502  TROPONINI <0.30   BNP: BNP (last 3 results) No results found for this basename: PROBNP,  in the last 8760 hours CBG:  Recent  Labs Lab 05/15/13 1644 05/15/13 2127 05/16/13 0041 05/16/13 0418 05/16/13 1106  GLUCAP 106* 96 122* 130* 119*       Signed:  Joseph Art  [facility] 05/16/2013, 1:08 PM

## 2013-05-16 NOTE — Progress Notes (Signed)
Patient unable to void spontaneously since in and out catheretization at 0300. Per conversation with Dr. Benjamine MolaVann, we both agree that it is best to insert foley catheter at this time and discharge the patient with a foley. Awaiting urine cultures at this time.

## 2013-05-17 DIAGNOSIS — R627 Adult failure to thrive: Secondary | ICD-10-CM

## 2013-05-17 LAB — URINE CULTURE: Colony Count: 70000

## 2013-05-17 LAB — CBC
HEMATOCRIT: 22.7 % — AB (ref 36.0–46.0)
HEMOGLOBIN: 7.6 g/dL — AB (ref 12.0–15.0)
MCH: 30.4 pg (ref 26.0–34.0)
MCHC: 33.5 g/dL (ref 30.0–36.0)
MCV: 90.8 fL (ref 78.0–100.0)
Platelets: 88 10*3/uL — ABNORMAL LOW (ref 150–400)
RBC: 2.5 MIL/uL — ABNORMAL LOW (ref 3.87–5.11)
RDW: 15.8 % — ABNORMAL HIGH (ref 11.5–15.5)
WBC: 7.5 10*3/uL (ref 4.0–10.5)

## 2013-05-17 LAB — GLUCOSE, CAPILLARY
GLUCOSE-CAPILLARY: 111 mg/dL — AB (ref 70–99)
GLUCOSE-CAPILLARY: 89 mg/dL (ref 70–99)
Glucose-Capillary: 91 mg/dL (ref 70–99)

## 2013-05-17 NOTE — Plan of Care (Signed)
Problem: Discharge Progression Outcomes Goal: ADLs with assistance Outcome: Not Progressing Patient requiring total assistance with ADLs

## 2013-05-17 NOTE — Progress Notes (Signed)
Subjective: 3 Days Post-Op Procedure(s) (LRB): INTRAMEDULLARY (IM) NAIL INTERTROCHANTRIC (Right) Patient reports pain as mild.    Objective: Vital signs in last 24 hours: Temp:  [97.6 F (36.4 C)-98.1 F (36.7 C)] 98 F (36.7 C) (04/24 2055) Pulse Rate:  [66-78] 78 (04/24 2055) Resp:  [16-20] 17 (04/25 0400) BP: (100-124)/(46-62) 124/46 mmHg (04/24 2055) SpO2:  [98 %-100 %] 100 % (04/25 0400)  Intake/Output from previous day: 04/24 0701 - 04/25 0700 In: 130 [P.O.:130] Out: 200 [Urine:200] Intake/Output this shift: Total I/O In: -  Out: 200 [Urine:200]   Recent Labs  05/14/13 0634 05/14/13 1916 05/15/13 0455 05/16/13 0410  HGB 8.9* 6.6* 9.3* 8.2*    Recent Labs  05/15/13 0455 05/16/13 0410  WBC 7.9 8.0  RBC 3.08* 2.73*  HCT 27.7* 24.9*  PLT 93* 74*    Recent Labs  05/15/13 0455 05/16/13 0410  NA 141 136*  K 4.4 4.1  CL 106 104  CO2 24 21  BUN 23 27*  CREATININE 1.71* 1.96*  GLUCOSE 107* 130*  CALCIUM 8.0* 7.8*   No results found for this basename: LABPT, INR,  in the last 72 hours  Neurologically intact  Assessment/Plan: 3 Days Post-Op Procedure(s) (LRB): INTRAMEDULLARY (IM) NAIL INTERTROCHANTRIC (Right) Discharge to SNF  Eldred MangesMark C Ebonique Hallstrom 05/17/2013, 5:53 AM

## 2013-05-17 NOTE — Plan of Care (Signed)
Problem: Discharge Progression Outcomes Goal: Ambulates safely using assistive device Outcome: Not Progressing Patient very lethargic. Not progressing with ambulation.

## 2013-05-17 NOTE — Progress Notes (Signed)
Patient is medically stable to return to [facility]. Per Tonian[facility] Fail[facility]end[facility] room 603[facility]A. Clinical Child psychotherapistocial Worker (CSW) sent D/C summary to [facility]. CSW prepared D/C packet and arranged non-emergency EMS ([facility]) for transport. Patient's daughter in law is at bedside and is aware of above. Nursing is aware of above. Please reconsult if further social work needs arise. CSW signing off.   Jetta LoutBailey Morgan, LCSWA Weekend CSW [phone]

## 2013-05-20 ENCOUNTER — Encounter (HOSPITAL_COMMUNITY): Payer: Self-pay | Admitting: Orthopedic Surgery

## 2013-06-15 ENCOUNTER — Encounter: Payer: Self-pay | Admitting: Internal Medicine

## 2013-07-10 ENCOUNTER — Encounter: Payer: Self-pay | Admitting: *Deleted

## 2013-07-22 ENCOUNTER — Encounter: Payer: Self-pay | Admitting: Internal Medicine

## 2013-07-22 ENCOUNTER — Ambulatory Visit (INDEPENDENT_AMBULATORY_CARE_PROVIDER_SITE_OTHER): Payer: MEDICARE | Admitting: Internal Medicine

## 2013-07-22 VITALS — BP 129/81 | HR 90 | Ht 66.0 in | Wt 130.0 lb

## 2013-07-22 DIAGNOSIS — I2510 Atherosclerotic heart disease of native coronary artery without angina pectoris: Secondary | ICD-10-CM

## 2013-07-22 DIAGNOSIS — R627 Adult failure to thrive: Secondary | ICD-10-CM

## 2013-07-22 DIAGNOSIS — I480 Paroxysmal atrial fibrillation: Secondary | ICD-10-CM

## 2013-07-22 DIAGNOSIS — Z950 Presence of cardiac pacemaker: Secondary | ICD-10-CM

## 2013-07-22 DIAGNOSIS — I4891 Unspecified atrial fibrillation: Secondary | ICD-10-CM

## 2013-07-22 DIAGNOSIS — I495 Sick sinus syndrome: Secondary | ICD-10-CM

## 2013-07-22 NOTE — Assessment & Plan Note (Signed)
Her [place] [employer] PPM is working normally. Will recheck in several months.  

## 2013-07-22 NOTE — Assessment & Plan Note (Signed)
She is out of rhythm approximately 12% of the time. She is not symptomatic. She is not a candidate for systemic anti-coagulation.

## 2013-07-22 NOTE — Progress Notes (Signed)
HPI Mr. Felicia West returns today for followup. She is a demented 80 yo woman with multiple medical problems who has progressively worsening health problems. She almost died in the fall from pneumonia and has lost over 30 lbs. She has a decubitus ulcer. She has anorexia and generalized fatigue. Her sons who are with her today state that she will not eat much except for simple carbohydrates like ginger ale and cookies. She will not eat Ensure or any supplemental foods.  Allergies  Allergen Reactions  . Penicillins Other (See Comments)    Pt states she "blacks out"  . Lidocaine      Current Outpatient Prescriptions  Medication Sig Dispense Refill  . acetaminophen (TYLENOL) 500 MG tablet Take 1 tablet (500 mg total) by mouth every 6 (six) hours as needed for mild pain.  30 tablet  0  . aspirin EC 325 MG tablet Take 1 tablet (325 mg total) by mouth daily.  30 tablet  0  . cholecalciferol (VITAMIN D) 1000 UNITS tablet Take 1,000 Units by mouth daily.      Marland Kitchen. escitalopram (LEXAPRO) 20 MG tablet Take 20 mg by mouth daily.      . feeding supplement, ENSURE, (ENSURE) PUDG Take 1 Container by mouth 3 (three) times daily between meals.    0  . mirtazapine (REMERON) 15 MG tablet Take 7.5 mg by mouth at bedtime.       . Multiple Vitamin (MULITIVITAMIN WITH MINERALS) TABS Take 1 tablet by mouth daily.      . vitamin E 400 UNIT capsule Take 400 Units by mouth daily.       . [DISCONTINUED] Calcium Carbonate-Vitamin D (CALCIUM 600+D) 600-200 MG-UNIT TABS Take by mouth daily.       No current facility-administered medications for this visit.     Past Medical History  Diagnosis Date  . Hypertension   . Pure hypercholesterolemia   . SSS (sick sinus syndrome)   . CAD (coronary artery disease)   . Dementia   . MI (myocardial infarction)   . GERD (gastroesophageal reflux disease)   . Diverticulosis   . Osteoporosis   . Dementia   . Coronary atherosclerosis of native coronary artery   . Benign  hypertensive heart disease without heart failure   . Non Q wave myocardial infarction   . Chronic headache   . CKD (chronic kidney disease)     ROS:   All systems reviewed and negative except as noted in the HPI.   Past Surgical History  Procedure Laterality Date  . Knee surgery    . Pacemaker insertion  2004  . Cardiac catheterization  09/09/02  . Vascular surgery    . Cholecystectomy    . Colectomy    . Intramedullary (im) nail intertrochanteric Right 05/14/2013    Procedure: INTRAMEDULLARY (IM) NAIL INTERTROCHANTRIC;  Surgeon: Nadara MustardMarcus V Duda, MD;  Location: [facility] OR;  Service: Orthopedics;  Laterality: Right;     Family History  Problem Relation Age of Onset  . Colon cancer Father   . Stroke Brother   . Pneumonia Mother      History   Social History  . Marital Status: Widowed    Spouse Name: N/A    Number of Children: N/A  . Years of Education: N/A   Occupational History  . Not on file.   Social History Main Topics  . Smoking status: Never Smoker   . Smokeless tobacco: Never Used  . Alcohol Use: No  .  Drug Use: No  . Sexual Activity: Not Currently   Other Topics Concern  . Not on file   Social History Narrative  . No narrative on file     BP 129/81  Pulse 90  Ht 5\' 6"  (1.676 m)  Wt 130 lb (58.968 kg)  BMI 20.99 kg/m2  Physical Exam:  Chronically ill appearing elderly woman, NAD HEENT: Unremarkable Neck:  No JVD, no thyromegally Back:  No CVA tenderness Lungs:  Clear with no wheezes. Distant lung sounds. HEART:  Regular rate rhythm, no murmurs, no rubs, no clicks Abd:  soft, positive bowel sounds, no organomegally, no rebound, no guarding Ext:  2 plus pulses, no edema, no cyanosis, no clubbing Skin:  No rashes no nodules Neuro:  CN II through XII intact, motor grossly intact   DEVICE  Normal device function.  See PaceArt for details. 12% atrial fib  Assess/Plan:

## 2013-07-22 NOTE — Patient Instructions (Signed)
Remote monitoring is used to monitor your pacemaker from home. This monitoring reduces the number of office visits required to check your device to one time per year. It allows us to keep an eye on the functioning of your device to ensure it is working properly. You are scheduled for a device check from home on 10-22-2013. You may send your transmission at any time that day. If you have a wireless device, the transmission will be sent automatically. After your physician reviews your transmission, you will receive a postcard with your next transmission date.  Your physician recommends that you schedule a follow-up appointment in: 12 months with Dr.Taylor     

## 2013-07-22 NOTE — Assessment & Plan Note (Signed)
I suspect her long term prognosis is very poor and related to her malnutrition. I have encouraged her to increase her oral intake.

## 2013-07-28 LAB — MDC_IDC_ENUM_SESS_TYPE_INCLINIC
Date Time Interrogation Session: 20150630040000
Implantable Pulse Generator Serial Number: 126257
Lead Channel Impedance Value: 425 Ohm
Lead Channel Pacing Threshold Amplitude: 0.9 V
Lead Channel Pacing Threshold Amplitude: 0.9 V
Lead Channel Pacing Threshold Pulse Width: 0.4 ms
Lead Channel Sensing Intrinsic Amplitude: 9.1 mV
Lead Channel Setting Pacing Amplitude: 2 V
Lead Channel Setting Pacing Pulse Width: 0.4 ms
Lead Channel Setting Sensing Sensitivity: 2.5 mV
MDC IDC MSMT LEADCHNL RA PACING THRESHOLD PULSEWIDTH: 0.4 ms
MDC IDC MSMT LEADCHNL RA SENSING INTR AMPL: 4.2 mV
MDC IDC MSMT LEADCHNL RV IMPEDANCE VALUE: 723 Ohm
MDC IDC SET LEADCHNL RV PACING AMPLITUDE: 1.6 V
Zone Setting Detection Interval: 375 ms

## 2013-08-06 ENCOUNTER — Encounter: Payer: Self-pay | Admitting: Internal Medicine

## 2013-09-17 ENCOUNTER — Encounter: Payer: Self-pay | Admitting: Internal Medicine

## 2013-10-22 ENCOUNTER — Ambulatory Visit (INDEPENDENT_AMBULATORY_CARE_PROVIDER_SITE_OTHER): Payer: MEDICARE | Admitting: *Deleted

## 2013-10-22 ENCOUNTER — Encounter: Payer: Self-pay | Admitting: Internal Medicine

## 2013-10-22 DIAGNOSIS — Z950 Presence of cardiac pacemaker: Secondary | ICD-10-CM

## 2013-10-22 DIAGNOSIS — I495 Sick sinus syndrome: Secondary | ICD-10-CM

## 2013-10-22 LAB — MDC_IDC_ENUM_SESS_TYPE_REMOTE
Battery Remaining Longevity: 6.5
Lead Channel Sensing Intrinsic Amplitude: 4 mV
Lead Channel Setting Pacing Amplitude: 1.6 V
Lead Channel Setting Pacing Amplitude: 2 V
MDC IDC MSMT LEADCHNL RA IMPEDANCE VALUE: 524 Ohm
MDC IDC MSMT LEADCHNL RV IMPEDANCE VALUE: 705 Ohm
MDC IDC MSMT LEADCHNL RV SENSING INTR AMPL: 9.9 mV
MDC IDC PG SERIAL: 126257
MDC IDC SET LEADCHNL RV PACING PULSEWIDTH: 0.4 ms
MDC IDC SET LEADCHNL RV SENSING SENSITIVITY: 2.5 mV
MDC IDC STAT BRADY RA PERCENT PACED: 56 %
MDC IDC STAT BRADY RV PERCENT PACED: 1 %
Zone Setting Detection Interval: 375 ms

## 2013-10-23 NOTE — Progress Notes (Signed)
Remote pacemaker transmission.   

## 2013-10-25 ENCOUNTER — Emergency Department (HOSPITAL_COMMUNITY)
Admission: EM | Admit: 2013-10-25 | Discharge: 2013-10-26 | Disposition: A | Discharge status: Home / Self Care | Payer: MEDICARE | Attending: Emergency Medicine | Admitting: Emergency Medicine

## 2013-10-25 ENCOUNTER — Encounter (HOSPITAL_COMMUNITY): Payer: Self-pay | Admitting: Emergency Medicine

## 2013-10-25 DIAGNOSIS — B029 Zoster without complications: Secondary | ICD-10-CM | POA: Diagnosis not present

## 2013-10-25 DIAGNOSIS — I252 Old myocardial infarction: Secondary | ICD-10-CM | POA: Insufficient documentation

## 2013-10-25 DIAGNOSIS — Z9889 Other specified postprocedural states: Secondary | ICD-10-CM | POA: Insufficient documentation

## 2013-10-25 DIAGNOSIS — I119 Hypertensive heart disease without heart failure: Secondary | ICD-10-CM | POA: Insufficient documentation

## 2013-10-25 DIAGNOSIS — Z880 Allergy status to penicillin: Secondary | ICD-10-CM | POA: Diagnosis not present

## 2013-10-25 DIAGNOSIS — Z791 Long term (current) use of non-steroidal anti-inflammatories (NSAID): Secondary | ICD-10-CM | POA: Diagnosis not present

## 2013-10-25 DIAGNOSIS — Z950 Presence of cardiac pacemaker: Secondary | ICD-10-CM | POA: Diagnosis not present

## 2013-10-25 DIAGNOSIS — I2510 Atherosclerotic heart disease of native coronary artery without angina pectoris: Secondary | ICD-10-CM | POA: Insufficient documentation

## 2013-10-25 DIAGNOSIS — F0390 Unspecified dementia without behavioral disturbance: Secondary | ICD-10-CM | POA: Diagnosis not present

## 2013-10-25 DIAGNOSIS — N189 Chronic kidney disease, unspecified: Secondary | ICD-10-CM | POA: Insufficient documentation

## 2013-10-25 DIAGNOSIS — G8929 Other chronic pain: Secondary | ICD-10-CM | POA: Insufficient documentation

## 2013-10-25 DIAGNOSIS — R079 Chest pain, unspecified: Secondary | ICD-10-CM | POA: Insufficient documentation

## 2013-10-25 DIAGNOSIS — K219 Gastro-esophageal reflux disease without esophagitis: Secondary | ICD-10-CM | POA: Insufficient documentation

## 2013-10-25 DIAGNOSIS — M549 Dorsalgia, unspecified: Secondary | ICD-10-CM | POA: Insufficient documentation

## 2013-10-25 DIAGNOSIS — Z7982 Long term (current) use of aspirin: Secondary | ICD-10-CM | POA: Diagnosis not present

## 2013-10-25 DIAGNOSIS — I129 Hypertensive chronic kidney disease with stage 1 through stage 4 chronic kidney disease, or unspecified chronic kidney disease: Secondary | ICD-10-CM | POA: Insufficient documentation

## 2013-10-25 DIAGNOSIS — Z8639 Personal history of other endocrine, nutritional and metabolic disease: Secondary | ICD-10-CM | POA: Insufficient documentation

## 2013-10-25 LAB — I-STAT TROPONIN, ED: Troponin i, poc: 0.02 ng/mL (ref 0.00–0.08)

## 2013-10-25 NOTE — Discharge Instructions (Signed)
Patient symptoms seem to be related to the shingles. We did do a troponin which was about any chest pain that would've occurred over the last several days or earlier today no evidence of acute cardiac event. Shingles are healing but still painful for patient patient to be discharged back to nursing facility.

## 2013-10-25 NOTE — ED Provider Notes (Addendum)
CSN: [id number]     Arrival date & time 10/25/13  1946 History   First MD Initiated Contact with Patient 10/25/13 2030     Chief Complaint  Patient presents with  . Chest Pain     (Consider location/radiation/quality/duration/timing/severity/associated sxs/prior Treatment) Patient is a 80 y.o. female presenting with chest pain. The history is provided by the patient and a relative.  Chest Pain Associated symptoms: back pain   Associated symptoms: no abdominal pain, no fever, no headache, no nausea, no shortness of breath and not vomiting    patient with a history of sick sinus syndrome has a pacemaker. Patient's been struggling with left thoracic dermatome shingles now for several weeks. Been on 2 courses of antibiotic as been on prednisone. Currently I concern tonight that perhaps the chest pain was related to cardiac chest pain. Patient is not complaining of any chest pain here symptoms been present for several days including more significant pain in the early part of the afternoon today. No nausea no vomiting no fevers.  Past Medical History  Diagnosis Date  . Hypertension   . Pure hypercholesterolemia   . SSS (sick sinus syndrome)   . CAD (coronary artery disease)   . Dementia   . MI (myocardial infarction)   . GERD (gastroesophageal reflux disease)   . Diverticulosis   . Osteoporosis   . Dementia   . Coronary atherosclerosis of native coronary artery   . Benign hypertensive heart disease without heart failure   . Non Q wave myocardial infarction   . Chronic headache   . CKD (chronic kidney disease)    Past Surgical History  Procedure Laterality Date  . Knee surgery    . Pacemaker insertion  2004  . Cardiac catheterization  09/09/02  . Vascular surgery    . Cholecystectomy    . Colectomy    . Intramedullary (im) nail intertrochanteric Right 05/14/2013    Procedure: INTRAMEDULLARY (IM) NAIL INTERTROCHANTRIC;  Surgeon: Nadara Mustard, MD;  Location: [facility] OR;  Service:  Orthopedics;  Laterality: Right;   Family History  Problem Relation Age of Onset  . Colon cancer Father   . Stroke Brother   . Pneumonia Mother    History  Substance Use Topics  . Smoking status: Never Smoker   . Smokeless tobacco: Never Used  . Alcohol Use: No   OB History   Grav Para Term Preterm Abortions TAB SAB Ect Mult Living                 Review of Systems  Constitutional: Negative for fever.  HENT: Negative for congestion.   Respiratory: Negative for shortness of breath.   Cardiovascular: Positive for chest pain.  Gastrointestinal: Negative for nausea, vomiting and abdominal pain.  Genitourinary: Negative for dysuria.  Musculoskeletal: Positive for back pain.  Skin: Positive for rash and wound.  Neurological: Negative for headaches.  Hematological: Does not bruise/bleed easily.  Psychiatric/Behavioral: Negative for confusion.      Allergies  Penicillins and Lidocaine  Home Medications   Prior to Admission medications   Medication Sig Start Date End Date Taking? Authorizing Provider  acetaminophen (TYLENOL) 500 MG tablet Take 1 tablet (500 mg total) by mouth every 6 (six) hours as needed for mild pain. 05/14/13  Yes Nadara Mustard, MD  aluminum-magnesium hydroxide-simethicone (MAALOX) 200-200-20 MG/5ML SUSP Take 10 mLs by mouth every 4 (four) hours as needed (for nausea).    Yes Historical Provider, MD  Amino Acids-Protein Hydrolys (FEEDING SUPPLEMENT, PRO-STAT SUGAR  FREE 64,) LIQD Take 30 mLs by mouth 3 (three) times daily with meals.   Yes Historical Provider, MD  amLODipine (NORVASC) 5 MG tablet Take 5 mg by mouth daily.   Yes Historical Provider, MD  aspirin EC 81 MG tablet Take 81 mg by mouth daily.   Yes Historical Provider, MD  escitalopram (LEXAPRO) 20 MG tablet Take 20 mg by mouth daily.   Yes Historical Provider, MD  mirtazapine (REMERON) 15 MG tablet Take 15 mg by mouth at bedtime.    Yes Historical Provider, MD  Multiple Vitamin (MULITIVITAMIN WITH  MINERALS) TABS Take 1 tablet by mouth daily.   Yes Historical Provider, MD  PROTEIN PO Take 120 mLs by mouth 3 (three) times daily between meals.   Yes Historical Provider, MD  QUEtiapine (SEROQUEL) 25 MG tablet Take 25 mg by mouth at bedtime.   Yes Historical Provider, MD  traMADol (ULTRAM) 50 MG tablet Take 50 mg by mouth 3 (three) times daily.   Yes Historical Provider, MD  vitamin E 400 UNIT capsule Take 400 Units by mouth daily.    Yes Historical Provider, MD  cholecalciferol (VITAMIN D) 1000 UNITS tablet Take 1,000 Units by mouth daily.    Historical Provider, MD  omeprazole (PRILOSEC) 20 MG capsule Take 20 mg by mouth daily.    Historical Provider, MD   BP 164/76  Pulse 90  Temp(Src) 97.4 F (36.3 C) (Oral)  Resp 22  Ht 5\' 7"  (1.702 m)  Wt 135 lb (61.236 kg)  BMI 21.14 kg/m2  SpO2 96% Physical Exam  Nursing note and vitals reviewed. Constitutional: She is oriented to person, place, and time. She appears well-developed and well-nourished. No distress.  HENT:  Head: Normocephalic and atraumatic.  Mouth/Throat: Oropharynx is clear and moist.  Eyes: Conjunctivae and EOM are normal. Pupils are equal, round, and reactive to light.  Neck: Normal range of motion.  Cardiovascular: Normal rate, regular rhythm and normal heart sounds.   No murmur heard. Pulmonary/Chest: Effort normal and breath sounds normal. No respiratory distress.  A palpable pacemaker left upper chest  Abdominal: Soft. Bowel sounds are normal. There is no tenderness.  Musculoskeletal: Normal range of motion.  Neurological: She is alert and oriented to person, place, and time. No cranial nerve deficit. She exhibits normal muscle tone. Coordination normal.  Skin: Skin is warm. No rash noted.    ED Course  Procedures (including critical care time) Labs Review Labs Reviewed  Rosezena Sensor, ED    Imaging Review No results found.   EKG Interpretation   Date/Time:  Saturday October 25 2013 20:02:38  EDT Ventricular Rate:  83 PR Interval:  156 QRS Duration: 108 QT Interval:  404 QTC Calculation: 475 R Axis:   -49 Text Interpretation:  Atrial-paced complexes LAD, consider left anterior  fascicular block Low voltage, precordial leads Abnormal R-wave  progression, early transition No significant change since last tracing  Confirmed by Manika Hast  MD, Antonio Creswell [phone]) on 10/25/2013 11:49:16 PM      MDM   Final diagnoses:  Shingles  Chest pain, unspecified chest pain type    Patient's pain is related to her shingles which involves the left side of her chest sore around the T6 dermatome maybe T5 involves some in the axillary area. Patient's had this now for several weeks. Family was concerned about an acute cardiac event. Patient wasn't complaining of any chest pain here. Quaker troponin was done which was negative. Patient's lungs are clear satting 94% or better. No concern  for an acute pulmonary vent. Patient will be discharge back to nursing facility. EKG without any acute cardiac changes compared old.   Vanetta MuldersScott Jakaiden Fill, MD 10/25/13 [phone]    Vanetta MuldersScott Kaithlyn Teagle, MD 10/25/13 [phone]

## 2013-10-25 NOTE — ED Notes (Signed)
To room via EMS.  Pts two sons at bedside.  Sons report pt has had epigastric, left arm and left back pain.  Pt does have shingles x 3 weeks across left breast, lateral left arm and across back. Pt denies pain at this time.  Sons report pt has had recent fevers, nursing home checked urine and it was negative.

## 2013-11-07 ENCOUNTER — Encounter: Payer: Self-pay | Admitting: Cardiology

## 2014-01-01 ENCOUNTER — Encounter (HOSPITAL_COMMUNITY): Payer: Self-pay | Admitting: Internal Medicine

## 2014-01-02 ENCOUNTER — Encounter (HOSPITAL_COMMUNITY): Payer: Self-pay | Admitting: *Deleted

## 2014-01-02 ENCOUNTER — Other Ambulatory Visit: Payer: Self-pay

## 2014-01-02 ENCOUNTER — Emergency Department (HOSPITAL_COMMUNITY)
Admission: EM | Admit: 2014-01-02 | Discharge: 2014-01-03 | Disposition: A | Discharge status: Skilled Nursing Facility | Payer: MEDICARE | Attending: Emergency Medicine | Admitting: Emergency Medicine

## 2014-01-02 DIAGNOSIS — I252 Old myocardial infarction: Secondary | ICD-10-CM | POA: Diagnosis not present

## 2014-01-02 DIAGNOSIS — Z7982 Long term (current) use of aspirin: Secondary | ICD-10-CM | POA: Insufficient documentation

## 2014-01-02 DIAGNOSIS — Z880 Allergy status to penicillin: Secondary | ICD-10-CM | POA: Diagnosis not present

## 2014-01-02 DIAGNOSIS — M810 Age-related osteoporosis without current pathological fracture: Secondary | ICD-10-CM | POA: Diagnosis not present

## 2014-01-02 DIAGNOSIS — Z79899 Other long term (current) drug therapy: Secondary | ICD-10-CM | POA: Diagnosis not present

## 2014-01-02 DIAGNOSIS — E780 Pure hypercholesterolemia: Secondary | ICD-10-CM | POA: Diagnosis not present

## 2014-01-02 DIAGNOSIS — K219 Gastro-esophageal reflux disease without esophagitis: Secondary | ICD-10-CM | POA: Diagnosis not present

## 2014-01-02 DIAGNOSIS — N189 Chronic kidney disease, unspecified: Secondary | ICD-10-CM | POA: Diagnosis not present

## 2014-01-02 DIAGNOSIS — F0390 Unspecified dementia without behavioral disturbance: Secondary | ICD-10-CM | POA: Insufficient documentation

## 2014-01-02 DIAGNOSIS — R079 Chest pain, unspecified: Secondary | ICD-10-CM | POA: Diagnosis present

## 2014-01-02 DIAGNOSIS — I2510 Atherosclerotic heart disease of native coronary artery without angina pectoris: Secondary | ICD-10-CM | POA: Diagnosis not present

## 2014-01-02 DIAGNOSIS — K5790 Diverticulosis of intestine, part unspecified, without perforation or abscess without bleeding: Secondary | ICD-10-CM | POA: Diagnosis not present

## 2014-01-02 DIAGNOSIS — Z950 Presence of cardiac pacemaker: Secondary | ICD-10-CM | POA: Diagnosis not present

## 2014-01-02 DIAGNOSIS — Z888 Allergy status to other drugs, medicaments and biological substances status: Secondary | ICD-10-CM | POA: Insufficient documentation

## 2014-01-02 DIAGNOSIS — R0789 Other chest pain: Secondary | ICD-10-CM

## 2014-01-02 DIAGNOSIS — I129 Hypertensive chronic kidney disease with stage 1 through stage 4 chronic kidney disease, or unspecified chronic kidney disease: Secondary | ICD-10-CM | POA: Diagnosis not present

## 2014-01-02 DIAGNOSIS — I495 Sick sinus syndrome: Secondary | ICD-10-CM | POA: Diagnosis not present

## 2014-01-02 NOTE — ED Notes (Signed)
[facility]. Pt c/o of central chest pain/pressure. Denies N/V, SOB, lightheadedness and dizziness. SNF gave aspirin 81mg  x2 and 325mg  x . EMS gave Nitro x1.

## 2014-01-03 ENCOUNTER — Emergency Department (HOSPITAL_COMMUNITY): Payer: MEDICARE

## 2014-01-03 DIAGNOSIS — R079 Chest pain, unspecified: Secondary | ICD-10-CM | POA: Diagnosis present

## 2014-01-03 DIAGNOSIS — I129 Hypertensive chronic kidney disease with stage 1 through stage 4 chronic kidney disease, or unspecified chronic kidney disease: Secondary | ICD-10-CM | POA: Diagnosis not present

## 2014-01-03 LAB — CBC
HCT: 31.7 % — ABNORMAL LOW (ref 36.0–46.0)
Hemoglobin: 9.9 g/dL — ABNORMAL LOW (ref 12.0–15.0)
MCH: 29.2 pg (ref 26.0–34.0)
MCHC: 31.2 g/dL (ref 30.0–36.0)
MCV: 93.5 fL (ref 78.0–100.0)
Platelets: 145 10*3/uL — ABNORMAL LOW (ref 150–400)
RBC: 3.39 MIL/uL — ABNORMAL LOW (ref 3.87–5.11)
RDW: 14.1 % (ref 11.5–15.5)
WBC: 5.7 10*3/uL (ref 4.0–10.5)

## 2014-01-03 LAB — BASIC METABOLIC PANEL
Anion gap: 10 (ref 5–15)
BUN: 22 mg/dL (ref 6–23)
CALCIUM: 8.9 mg/dL (ref 8.4–10.5)
CO2: 28 mEq/L (ref 19–32)
Chloride: 105 mEq/L (ref 96–112)
Creatinine, Ser: 1.39 mg/dL — ABNORMAL HIGH (ref 0.50–1.10)
GFR calc Af Amer: 36 mL/min — ABNORMAL LOW (ref >90)
GFR, EST NON AFRICAN AMERICAN: 31 mL/min — AB (ref >90)
GLUCOSE: 116 mg/dL — AB (ref 70–99)
Potassium: 4.3 mEq/L (ref 3.7–5.3)
SODIUM: 143 meq/L (ref 137–147)

## 2014-01-03 LAB — I-STAT TROPONIN, ED: Troponin i, poc: 0 ng/mL (ref 0.00–0.08)

## 2014-01-03 LAB — TROPONIN I: Troponin I: <0.3 ng/mL (ref <0.30)

## 2014-01-03 NOTE — ED Provider Notes (Signed)
CSN: [id number]     Arrival date & time 01/02/14  2358 History   First MD Initiated Contact with Patient 01/03/14 0011     This chart was scribed for Felicia West M Shellye Zandi, MD by Arlan OrganAshley Leger, ED Scribe. This patient was seen in room D35C/D35C and the patient's care was started 12:12 AM.   Chief Complaint  Patient presents with  . Chest Pain   The history is provided by the patient. No language interpreter was used.    HPI Comments: Felicia West [facility] by EMS [facility] is a 80 y.o. female with a PMHx of HTN, CAD, GERD, osteoporosis, mi, and CKD who presents to the Emergency Department complaining of cental chest pain onset just prior to arrival. She describes discomfort as dull. Pt reports approximately 3-4 episodes since onset. However, currently she is pain free. Pt was given Aspirin 81 mg and 1 dose of Nitro while en route to ED. She denies any nausea, vomiting, abdominal pain, SOB, dizziness, or lightheadedness. Pt states she has not eaten in a few days due to not being hungry. Pt with known allergies to penicillins and lidocaine.  She is followed by Dr. Georgann HousekeeperKarrar Husain PCP She is not currently followed by a cardiologist  Past Medical History  Diagnosis Date  . Hypertension   . Pure hypercholesterolemia   . SSS (sick sinus syndrome)   . CAD (coronary artery disease)   . Dementia   . MI (myocardial infarction)   . GERD (gastroesophageal reflux disease)   . Diverticulosis   . Osteoporosis   . Dementia   . Coronary atherosclerosis of native coronary artery   . Benign hypertensive heart disease without heart failure   . Non Q wave myocardial infarction   . Chronic headache   . CKD (chronic kidney disease)    Past Surgical History  Procedure Laterality Date  . Knee surgery    . Pacemaker insertion  2004  . Cardiac catheterization  09/09/02  . Vascular surgery    . Cholecystectomy    . Colectomy    . Intramedullary (im) nail intertrochanteric Right 05/14/2013     Procedure: INTRAMEDULLARY (IM) NAIL INTERTROCHANTRIC;  Surgeon: Nadara MustardMarcus V Duda, MD;  Location: [facility] OR;  Service: Orthopedics;  Laterality: Right;  . Pacemaker generator change N/A 03/30/2011    Procedure: PACEMAKER GENERATOR CHANGE;  Surgeon: Marinus MawGregg W Taylor, MD;  [facility];  Service: Cardiovascular;  Laterality: N/A;   Family History  Problem Relation Age of Onset  . Colon cancer Father   . Stroke Brother   . Pneumonia Mother    History  Substance Use Topics  . Smoking status: Never Smoker   . Smokeless tobacco: Never Used  . Alcohol Use: No   OB History    No data available     Review of Systems  Constitutional: Negative for fever and chills.  Respiratory: Negative for shortness of breath.   Cardiovascular: Positive for chest pain.  Gastrointestinal: Negative for nausea, vomiting and abdominal pain.  Neurological: Negative for dizziness and light-headedness.      Allergies  Penicillins and Lidocaine  Home Medications   Prior to Admission medications   Medication Sig Start Date End Date Taking? Authorizing Provider  acetaminophen (TYLENOL) 500 MG tablet Take 1 tablet (500 mg total) by mouth every 6 (six) hours as needed for mild pain. 05/14/13   Nadara MustardMarcus Duda V, MD  aluminum-magnesium hydroxide-simethicone (MAALOX) 200-200-20 MG/5ML SUSP Take 10 mLs by mouth every 4 (  four) hours as needed (for nausea).     Historical Provider, MD  Amino Acids-Protein Hydrolys (FEEDING SUPPLEMENT, PRO-STAT SUGAR FREE 64,) LIQD Take 30 mLs by mouth 3 (three) times daily with meals.    Historical Provider, MD  amLODipine (NORVASC) 5 MG tablet Take 5 mg by mouth daily.    Historical Provider, MD  aspirin EC 81 MG tablet Take 81 mg by mouth daily.    Historical Provider, MD  cholecalciferol (VITAMIN D) 1000 UNITS tablet Take 1,000 Units by mouth daily.    Historical Provider, MD  escitalopram (LEXAPRO) 20 MG tablet Take 20 mg by mouth daily.    Historical Provider, MD  mirtazapine  (REMERON) 15 MG tablet Take 15 mg by mouth at bedtime.     Historical Provider, MD  Multiple Vitamin (MULITIVITAMIN WITH MINERALS) TABS Take 1 tablet by mouth daily.    Historical Provider, MD  omeprazole (PRILOSEC) 20 MG capsule Take 20 mg by mouth daily.    Historical Provider, MD  PROTEIN PO Take 120 mLs by mouth 3 (three) times daily between meals.    Historical Provider, MD  QUEtiapine (SEROQUEL) 25 MG tablet Take 25 mg by mouth at bedtime.    Historical Provider, MD  traMADol (ULTRAM) 50 MG tablet Take 50 mg by mouth 3 (three) times daily.    Historical Provider, MD  vitamin E 400 UNIT capsule Take 400 Units by mouth daily.     Historical Provider, MD   Triage Vitals: BP 116/97 mmHg  Pulse 70  Temp(Src) 98.2 F (36.8 C) (Oral)  Resp 21  SpO2 94%   Physical Exam  Constitutional: She appears well-developed and well-nourished. No distress.  Frail, elderly female, no acute distress  HENT:  Head: Normocephalic and atraumatic.  Nose: Nose normal.  Mouth/Throat: Oropharynx is clear and moist.  Eyes: Conjunctivae and EOM are normal. Pupils are equal, round, and reactive to light.  Neck: Normal range of motion. Neck supple. No JVD present. No tracheal deviation present. No thyromegaly present.  Cardiovascular: Normal rate, regular rhythm, normal heart sounds and intact distal pulses.  Exam reveals no gallop and no friction rub.   No murmur heard. Pulmonary/Chest: Effort normal and breath sounds normal. No stridor. No respiratory distress. She has no wheezes. She has no rales. She exhibits no tenderness.  Abdominal: Soft. Bowel sounds are normal. She exhibits no distension and no mass. There is no tenderness. There is no rebound and no guarding.  Musculoskeletal: Normal range of motion. She exhibits no edema or tenderness.  Lymphadenopathy:    She has no cervical adenopathy.  Neurological: She is alert. She displays normal reflexes. She exhibits normal muscle tone. Coordination normal.   Skin: Skin is warm and dry. No rash noted. No erythema. No pallor.  Psychiatric: She has a normal mood and affect. Her behavior is normal. Judgment and thought content normal.  Nursing note and vitals reviewed.   ED Course  Procedures (including critical care time)  DIAGNOSTIC STUDIES: Oxygen Saturation is 94% on RA, adequate by my interpretation.    COORDINATION OF CARE: 12:11 AM- Will order DG chest port 1 view, CBC, CMP, and I-stat troponin. Discussed treatment plan with pt at bedside and pt agreed to plan.     Labs Review Labs Reviewed  CBC - Abnormal; Notable for the following:    RBC 3.39 (*)    Hemoglobin 9.9 (*)    HCT 31.7 (*)    Platelets 145 (*)    All other components within  normal limits  BASIC METABOLIC PANEL - Abnormal; Notable for the following:    Glucose, Bld 116 (*)    Creatinine, Ser 1.39 (*)    GFR calc non Af Amer 31 (*)    GFR calc Af Amer 36 (*)    All other components within normal limits  Rosezena SensorI-STAT TROPOININ, ED    Imaging Review Dg Chest Port 1 View  01/03/2014   CLINICAL DATA:  Chest pain.  EXAM: PORTABLE CHEST - 1 VIEW  COMPARISON:  05/14/2013  FINDINGS: Cardiac pacemaker. Surgical clips in the right upper quadrant. Shallow inspiration. Heart size and pulmonary vascularity are normal. Emphysematous changes in the lungs with scattered fibrosis. No focal airspace disease or consolidation. No blunting of costophrenic angles. No pneumothorax. Calcification of the aorta. Degenerative changes in the spine and shoulders.  IMPRESSION: Probable emphysematous changes and fibrosis in the lungs. No evidence of active pulmonary disease.   Electronically Signed   By: Burman NievesWilliam  Stevens M.D.   On: 01/03/2014 01:00     EKG Interpretation None      Date: 01/02/2014  Rate: 70  Rhythm: atrial paced  QRS Axis: left  Intervals: normal  ST/T Wave abnormalities: normal  Conduction Disutrbances:none  Narrative Interpretation:   Old EKG Reviewed: none  available   MDM   Final diagnoses:  Chest pain of uncertain etiology    80 year old female with central chest pain described as a pressure with some radiation to her left arm.  Patient reports that symptoms are similar to her prior MI.  Patient with some mild dementia, but is able to answer questions easily.  Patient has history of coronary disease, MI.  Plan for cardiac workup and possible admission to cardiologist for chest pain observation  I personally performed the services described in this documentation, which was scribed in my presence. The recorded information has been reviewed and is accurate.   Signout the bedside, reports patient has been dealing with pain from shingles on and off for last several weeks.  Lesions have scarred over.  He reports that patient did have different complaints of pain today.  Per the nursing facility, but he feels they're most likely due to her on growing chronic pain from shingles.  Patient is DO NOT RESUSCITATE, 89, and at this point, they don't want any serious interventions.  Son requests that if her second troponin is negative, that she be discharged back to her nursing facility as she has problems with change in routine as far as her dementia goes.  Second troponin is negative, and I feel that patient is stable for discharge back to her nursing facility.  Felicia West M Jacorion Klem, MD 01/03/14 [phone]

## 2014-01-03 NOTE — ED Notes (Signed)
Pt remains monitored by blood pressure and pulse ox. Pts family remains at bedside.  

## 2014-01-03 NOTE — Discharge Instructions (Signed)
Your cardiac enzymes tonight were normal.  No signs of serious heart damage was noted on your workup.  Please follow up with your doctor.   Chest Pain (Nonspecific) It is often hard to give a diagnosis for the cause of chest pain. There is always a chance that your pain could be related to something serious, such as a heart attack or a blood clot in the lungs. You need to follow up with your doctor. HOME CARE  If antibiotic medicine was given, take it as directed by your doctor. Finish the medicine even if you start to feel better.  For the next few days, avoid activities that bring on chest pain. Continue physical activities as told by your doctor.  Do not use any tobacco products. This includes cigarettes, chewing tobacco, and e-cigarettes.  Avoid drinking alcohol.  Only take medicine as told by your doctor.  Follow your doctor's suggestions for more testing if your chest pain does not go away.  Keep all doctor visits you made. GET HELP IF:  Your chest pain does not go away, even after treatment.  You have a rash with blisters on your chest.  You have a fever. GET HELP RIGHT AWAY IF:   You have more pain or pain that spreads to your arm, neck, jaw, back, or belly (abdomen).  You have shortness of breath.  You cough more than usual or cough up blood.  You have very bad back or belly pain.  You feel sick to your stomach (nauseous) or throw up (vomit).  You have very bad weakness.  You pass out (faint).  You have chills. This is an emergency. Do not wait to see if the problems will go away. Call your local emergency services (911 in [place] Do not drive yourself to the hospital. MAKE SURE YOU:   Understand these instructions.  Will watch your condition.  Will get help right away if you are not doing well or get worse. Document Released: 06/28/2007 Document Revised: 01/14/2013 Document Reviewed: 06/28/2007 [facility] Patient Information 2015 [employer] [place] [place]. This  information is not intended to replace advice given to you by your health care provider. Make sure you discuss any questions you have with your health care provider.

## 2014-01-28 ENCOUNTER — Ambulatory Visit (INDEPENDENT_AMBULATORY_CARE_PROVIDER_SITE_OTHER): Payer: MEDICARE | Admitting: *Deleted

## 2014-01-28 ENCOUNTER — Encounter: Payer: Self-pay | Admitting: Internal Medicine

## 2014-01-28 DIAGNOSIS — I495 Sick sinus syndrome: Secondary | ICD-10-CM

## 2014-01-28 NOTE — Progress Notes (Signed)
Remote pacemaker transmission.   

## 2014-02-04 LAB — MDC_IDC_ENUM_SESS_TYPE_REMOTE
Battery Remaining Percentage: 100 %
Brady Statistic RA Percent Paced: 66 %
Brady Statistic RV Percent Paced: 1 %
Date Time Interrogation Session: 20160106060100
Implantable Pulse Generator Serial Number: 126257
Lead Channel Impedance Value: 487 Ohm
Lead Channel Impedance Value: 635 Ohm
Lead Channel Pacing Threshold Amplitude: 0.9 V
Lead Channel Pacing Threshold Pulse Width: 0.4 ms
Lead Channel Pacing Threshold Pulse Width: 0.4 ms
Lead Channel Setting Pacing Amplitude: 2 V
MDC IDC MSMT BATTERY REMAINING LONGEVITY: 78 mo
MDC IDC MSMT LEADCHNL RV PACING THRESHOLD AMPLITUDE: 0.9 V
MDC IDC SET LEADCHNL RV PACING AMPLITUDE: 1.5 V
MDC IDC SET LEADCHNL RV PACING PULSEWIDTH: 0.4 ms
MDC IDC SET LEADCHNL RV SENSING SENSITIVITY: 2.5 mV
Zone Setting Detection Interval: 375 ms

## 2014-02-05 ENCOUNTER — Encounter (HOSPITAL_COMMUNITY): Payer: Self-pay | Admitting: Orthopedic Surgery

## 2014-02-19 ENCOUNTER — Encounter: Payer: Self-pay | Admitting: Cardiology

## 2014-05-04 ENCOUNTER — Ambulatory Visit (INDEPENDENT_AMBULATORY_CARE_PROVIDER_SITE_OTHER): Payer: MEDICARE | Admitting: *Deleted

## 2014-05-04 DIAGNOSIS — I495 Sick sinus syndrome: Secondary | ICD-10-CM

## 2014-05-04 LAB — MDC_IDC_ENUM_SESS_TYPE_REMOTE
Brady Statistic RV Percent Paced: 1 %
Date Time Interrogation Session: 20160411050100
Implantable Pulse Generator Serial Number: 126257
Lead Channel Impedance Value: 550 Ohm
Lead Channel Setting Pacing Amplitude: 1.5 V
Lead Channel Setting Pacing Amplitude: 2 V
Lead Channel Setting Pacing Pulse Width: 0.4 ms
MDC IDC MSMT BATTERY REMAINING LONGEVITY: 72 mo
MDC IDC MSMT BATTERY REMAINING PERCENTAGE: 100 %
MDC IDC MSMT LEADCHNL RV IMPEDANCE VALUE: 625 Ohm
MDC IDC SET LEADCHNL RV SENSING SENSITIVITY: 2.5 mV
MDC IDC STAT BRADY RA PERCENT PACED: 74 %
Zone Setting Detection Interval: 375 ms

## 2014-05-04 NOTE — Progress Notes (Signed)
Remote pacemaker transmission.   

## 2014-05-18 ENCOUNTER — Encounter: Payer: Self-pay | Admitting: Cardiology

## 2014-05-20 ENCOUNTER — Encounter: Payer: Self-pay | Admitting: Internal Medicine

## 2014-05-27 ENCOUNTER — Telehealth: Payer: Self-pay | Admitting: Internal Medicine

## 2014-05-27 NOTE — Telephone Encounter (Signed)
New problem    Pt's son went to social services and they stated pt didn't have a prescription from doctor stating she need a telephone for her monitoring system. Please call pt's son.

## 2014-05-27 NOTE — Telephone Encounter (Signed)
lmtcb

## 2014-05-28 NOTE — Telephone Encounter (Signed)
Pt's son, Hessie Dienerlan, states pt's mother requires a prescription due to Dept. of Social Services regulations. They need a signed prescription from Dr. Ladona Ridgelaylor to keep a phone line connected to his mother's Latitude monitor.   I will send a letter and a [place] sheet to [place]. Hessie Dienerlan is aware Dr. Ladona Ridgelaylor is not in office until 06/02/14.

## 2014-06-02 ENCOUNTER — Encounter: Payer: Self-pay | Admitting: *Deleted

## 2014-08-25 ENCOUNTER — Encounter: Payer: MEDICARE | Admitting: Internal Medicine

## 2014-08-27 ENCOUNTER — Encounter: Payer: Self-pay | Admitting: Internal Medicine

## 2014-10-29 ENCOUNTER — Encounter: Payer: Self-pay | Admitting: Internal Medicine

## 2014-11-03 ENCOUNTER — Encounter: Payer: Self-pay | Admitting: Internal Medicine

## 2014-11-04 ENCOUNTER — Encounter: Payer: Self-pay | Admitting: Internal Medicine

## 2014-11-04 ENCOUNTER — Ambulatory Visit (INDEPENDENT_AMBULATORY_CARE_PROVIDER_SITE_OTHER): Payer: MEDICARE | Admitting: Internal Medicine

## 2014-11-04 VITALS — BP 160/70 | HR 70 | Ht 67.0 in | Wt 154.0 lb

## 2014-11-04 DIAGNOSIS — I480 Paroxysmal atrial fibrillation: Secondary | ICD-10-CM

## 2014-11-04 DIAGNOSIS — Z950 Presence of cardiac pacemaker: Secondary | ICD-10-CM

## 2014-11-04 DIAGNOSIS — I10 Essential (primary) hypertension: Secondary | ICD-10-CM | POA: Diagnosis not present

## 2014-11-04 DIAGNOSIS — I495 Sick sinus syndrome: Secondary | ICD-10-CM

## 2014-11-04 NOTE — Assessment & Plan Note (Signed)
She is maintaining NSR. She is not a candidate for systemic anti-coagulation.

## 2014-11-04 NOTE — Patient Instructions (Signed)
Medication Instructions:  Your physician recommends that you continue on your current medications as directed. Please refer to the Current Medication list given to you today.   Labwork: None ordered   Testing/Procedures: None ordered   Follow-Up: Your physician wants you to follow-up in: 12 months with Dr Court Joyaylor You will receive a reminder letter in the mail two months in advance. If you don't receive a letter, please call our office to schedule the follow-up appointment.  Remote monitoring is used to monitor your Pacemaker of ICD from home. This monitoring reduces the number of office visits required to check your device to one time per year. It allows us to keep an eye on the functioning of your device to ensure it is working properly. You are scheduled for a device check from home on 02/03/15. You may send your transmission at any time that day. If you have a wireless device, the transmission will be sent automatically. After your physician reviews your transmission, you will receive a postcard with your next transmission date.    Any Other Special Instructions Will Be Listed Below (If Applicable).

## 2014-11-04 NOTE — Assessment & Plan Note (Signed)
Her [place] [facility] DDD PM is working normally. Will recheck in several months. 

## 2014-11-04 NOTE — Assessment & Plan Note (Signed)
Her blood pressure is high. At her advanced age, will not be aggressive at lowering her blood pressure.

## 2014-11-04 NOTE — Progress Notes (Signed)
HPI Mr. Felicia West returns today for followup. She is a demented 81 yo woman with multiple medical problems who has improved markedly since her last visit. She has gained weight in the interim. No other complaints. 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 Her son's note that she cannot transfer. Allergies  Allergen Reactions  . Penicillins Other (See Comments)    Syncopal event  . Lidocaine Other (See Comments)     Current Outpatient Prescriptions  Medication Sig Dispense Refill  . acetaminophen (TYLENOL) 500 MG tablet Take 1 tablet (500 mg total) by mouth every 6 (six) hours as needed for mild pain. 30 tablet 0  . aluminum-magnesium hydroxide-simethicone (MAALOX) 200-200-20 MG/5ML SUSP Take 10 mLs by mouth every 4 (four) hours as needed (for nausea).     . Amino Acids-Protein Hydrolys (FEEDING SUPPLEMENT, PRO-STAT SUGAR FREE 64,) LIQD Take 30 mLs by mouth 3 (three) times daily with meals.    Marland Kitchen. amLODipine (NORVASC) 5 MG tablet Take 5 mg by mouth daily.    Marland Kitchen. aspirin 81 MG chewable tablet Chew 81 mg by mouth daily.    . cholecalciferol (VITAMIN D) 1000 UNITS tablet Take 1,000 Units by mouth daily.    Marland Kitchen. docusate sodium (COLACE) 100 MG capsule Take 100 mg by mouth 2 (two) times daily.    Marland Kitchen. escitalopram (LEXAPRO) 20 MG tablet Take 20 mg by mouth daily.    Marland Kitchen. gabapentin (NEURONTIN) 300 MG capsule Take 300 mg by mouth 3 (three) times daily.    . mirtazapine (REMERON) 7.5 MG tablet Take 7.5 mg by mouth at bedtime.    . Multiple Vitamin (MULITIVITAMIN WITH MINERALS) TABS Take 1 tablet by mouth daily.    Marland Kitchen. omeprazole (PRILOSEC) 20 MG capsule Take 20 mg by mouth daily.    Marland Kitchen. oxyCODONE-acetaminophen (PERCOCET/ROXICET) 5-325 MG per tablet Take 1 tablet by mouth at bedtime.    Marland Kitchen. PROTEIN PO Take 120 mLs by mouth 3 (three) times daily between meals.    Marland Kitchen. QUEtiapine (SEROQUEL) 25 MG tablet Take 25 mg by mouth at bedtime.    . traMADol (ULTRAM) 50 MG tablet Take 50 mg by mouth every 6 (six) hours as  needed for moderate pain.     . vitamin E 400 UNIT capsule Take 400 Units by mouth daily.     . [DISCONTINUED] Calcium Carbonate-Vitamin D (CALCIUM 600+D) 600-200 MG-UNIT TABS Take by mouth daily.     No current facility-administered medications for this visit.     Past Medical History  Diagnosis Date  . Hypertension   . Pure hypercholesterolemia   . SSS (sick sinus syndrome) (HCC)   . CAD (coronary artery disease)   . Dementia   . MI (myocardial infarction) (HCC)   . GERD (gastroesophageal reflux disease)   . Diverticulosis   . Osteoporosis   . Dementia   . Coronary atherosclerosis of native coronary artery   . Benign hypertensive heart disease without heart failure   . Non Q wave myocardial infarction (HCC)   . Chronic headache   . CKD (chronic kidney disease)     ROS:   All systems reviewed and negative except as noted in the HPI.   Past Surgical History  Procedure Laterality Date  . Knee surgery    . Pacemaker insertion  2004  . Cardiac catheterization  09/09/02  . Vascular surgery    . Cholecystectomy    . Colectomy    . Intramedullary (im) nail intertrochanteric Right 05/14/2013  Procedure: INTRAMEDULLARY (IM) NAIL INTERTROCHANTRIC;  Surgeon: Nadara Mustard, MD;  Location: [facility] OR;  Service: Orthopedics;  Laterality: Right;  . Pacemaker generator change N/A 03/30/2011    Procedure: PACEMAKER GENERATOR CHANGE;  Surgeon: Marinus Maw, MD;  Location: [facility] CATH LAB;  Service: Cardiovascular;  Laterality: N/A;     Family History  Problem Relation Age of Onset  . Colon cancer Father   . Stroke Brother   . Pneumonia Mother   . Cancer Father     BOWEL  . Heart attack Neg Hx   . Hypertension Neg Hx      Social History   Social History  . Marital Status: Widowed    Spouse Name: N/A  . Number of Children: N/A  . Years of Education: N/A   Occupational History  . Not on file.   Social History Main Topics  . Smoking status: Never Smoker   . Smokeless  tobacco: Never Used  . Alcohol Use: No  . Drug Use: No  . Sexual Activity: Not Currently   Other Topics Concern  . Not on file   Social History Narrative     BP 160/70 mmHg  Pulse 70  Ht  (1.702 m)  Wt 154 lb (69.854 kg)  BMI 24.11 kg/m2  Physical Exam:  Chronically ill appearing elderly woman, NAD HEENT: Unremarkable Neck:  No JVD, no thyromegally Back:  No CVA tenderness Lungs:  Clear with no wheezes. Distant lung sounds. HEART:  Regular rate rhythm, no murmurs, no rubs, no clicks Abd:  soft, positive bowel sounds, no organomegally, no rebound, no guarding Ext:  2 plus pulses, no edema, no cyanosis, no clubbing Skin:  No rashes no nodules Neuro:  CN II through XII intact, motor grossly intact   DEVICE  Normal device function.  See PaceArt for details.   Assess/Plan:

## 2014-11-06 ENCOUNTER — Encounter: Payer: Self-pay | Admitting: Internal Medicine

## 2014-11-06 LAB — CUP PACEART INCLINIC DEVICE CHECK
Implantable Lead Implant Date: 20130307
Implantable Lead Location: 753859
Implantable Lead Location: 753860
Implantable Lead Model: 4086
Implantable Lead Serial Number: 219506
Lead Channel Pacing Threshold Amplitude: 1.1 V
Lead Channel Pacing Threshold Amplitude: 1.2 V
Lead Channel Pacing Threshold Pulse Width: 0.4 ms
Lead Channel Sensing Intrinsic Amplitude: 3.7 mV
Lead Channel Sensing Intrinsic Amplitude: 8.1 mV
Lead Channel Setting Pacing Amplitude: 1.6 V
Lead Channel Setting Pacing Amplitude: 2 V
Lead Channel Setting Sensing Sensitivity: 2.5 mV
MDC IDC LEAD IMPLANT DT: 20130307
MDC IDC LEAD SERIAL: 211950
MDC IDC MSMT LEADCHNL RV PACING THRESHOLD PULSEWIDTH: 0.4 ms
MDC IDC SESS DTM: 20161012040000
MDC IDC SET LEADCHNL RV PACING PULSEWIDTH: 0.4 ms
MDC IDC SET ZONE VENDOR TYPE: 771137
MDC IDC STAT BRADY RA PERCENT PACED: 81 %
MDC IDC STAT BRADY RV PERCENT PACED: 1 %
Pulse Gen Serial Number: 126257
Zone Setting Detection Interval: 375 ms

## 2015-02-03 ENCOUNTER — Ambulatory Visit (INDEPENDENT_AMBULATORY_CARE_PROVIDER_SITE_OTHER): Payer: MEDICARE | Admitting: *Deleted

## 2015-02-03 DIAGNOSIS — I495 Sick sinus syndrome: Secondary | ICD-10-CM

## 2015-02-03 NOTE — Progress Notes (Signed)
Remote pacemaker transmission.   

## 2015-02-23 LAB — CUP PACEART REMOTE DEVICE CHECK
Battery Remaining Percentage: 95 %
Brady Statistic RA Percent Paced: 96 %
Brady Statistic RV Percent Paced: 0 %
Implantable Lead Implant Date: 20130307
Implantable Lead Implant Date: 20130307
Implantable Lead Location: 753860
Implantable Lead Model: 4086
Implantable Lead Model: 4087
Lead Channel Impedance Value: 536 Ohm
Lead Channel Pacing Threshold Amplitude: 1 V
Lead Channel Pacing Threshold Amplitude: 1.2 V
Lead Channel Setting Sensing Sensitivity: 2.5 mV
MDC IDC LEAD LOCATION: 753859
MDC IDC LEAD SERIAL: 211950
MDC IDC LEAD SERIAL: 219506
MDC IDC MSMT BATTERY REMAINING LONGEVITY: 60 mo
MDC IDC MSMT LEADCHNL RA PACING THRESHOLD PULSEWIDTH: 0.4 ms
MDC IDC MSMT LEADCHNL RV IMPEDANCE VALUE: 654 Ohm
MDC IDC MSMT LEADCHNL RV PACING THRESHOLD PULSEWIDTH: 0.4 ms
MDC IDC PG SERIAL: 126257
MDC IDC SESS DTM: 20170111064400
MDC IDC SET LEADCHNL RA PACING AMPLITUDE: 2.4 V
MDC IDC SET LEADCHNL RV PACING AMPLITUDE: 1.5 V
MDC IDC SET LEADCHNL RV PACING PULSEWIDTH: 0.4 ms

## 2015-02-24 ENCOUNTER — Encounter: Payer: Self-pay | Admitting: Cardiology

## 2015-03-10 ENCOUNTER — Encounter: Payer: Self-pay | Admitting: Cardiology

## 2015-05-05 ENCOUNTER — Ambulatory Visit (INDEPENDENT_AMBULATORY_CARE_PROVIDER_SITE_OTHER): Payer: MEDICARE | Admitting: *Deleted

## 2015-05-05 ENCOUNTER — Telehealth: Payer: Self-pay | Admitting: Cardiology

## 2015-05-05 DIAGNOSIS — I495 Sick sinus syndrome: Secondary | ICD-10-CM | POA: Diagnosis not present

## 2015-05-05 NOTE — Telephone Encounter (Signed)
Confirmed remote transmission w/ pt caregiver.   

## 2015-05-10 NOTE — Progress Notes (Signed)
Remote pacemaker transmission.   

## 2015-06-15 ENCOUNTER — Encounter: Payer: Self-pay | Admitting: Cardiology

## 2015-06-15 LAB — CUP PACEART REMOTE DEVICE CHECK
Battery Remaining Percentage: 88 %
Brady Statistic RA Percent Paced: 96 %
Brady Statistic RV Percent Paced: 0 %
Date Time Interrogation Session: 20170412221000
Implantable Lead Implant Date: 20130307
Implantable Lead Location: 753859
Implantable Lead Location: 753860
Implantable Lead Serial Number: 219506
Lead Channel Setting Pacing Amplitude: 1.5 V
Lead Channel Setting Pacing Amplitude: 2.4 V
Lead Channel Setting Pacing Pulse Width: 0.4 ms
Lead Channel Setting Sensing Sensitivity: 2.5 mV
MDC IDC LEAD IMPLANT DT: 20130307
MDC IDC LEAD SERIAL: 211950
MDC IDC MSMT BATTERY REMAINING LONGEVITY: 54 mo
MDC IDC MSMT LEADCHNL RA IMPEDANCE VALUE: 541 Ohm
MDC IDC MSMT LEADCHNL RV IMPEDANCE VALUE: 765 Ohm
MDC IDC MSMT LEADCHNL RV PACING THRESHOLD AMPLITUDE: 1 V
MDC IDC MSMT LEADCHNL RV PACING THRESHOLD PULSEWIDTH: 0.4 ms
MDC IDC PG SERIAL: 126257

## 2015-08-04 ENCOUNTER — Ambulatory Visit (INDEPENDENT_AMBULATORY_CARE_PROVIDER_SITE_OTHER): Payer: MEDICARE | Admitting: *Deleted

## 2015-08-04 DIAGNOSIS — I495 Sick sinus syndrome: Secondary | ICD-10-CM

## 2015-08-04 NOTE — Progress Notes (Signed)
Remote pacemaker transmission.   

## 2015-08-06 ENCOUNTER — Encounter: Payer: Self-pay | Admitting: Cardiology

## 2015-08-10 LAB — CUP PACEART REMOTE DEVICE CHECK
Battery Remaining Longevity: 54 mo
Battery Remaining Percentage: 86 %
Brady Statistic RV Percent Paced: 0 %
Implantable Lead Model: 4087
Implantable Lead Serial Number: 211950
Implantable Lead Serial Number: 219506
Lead Channel Impedance Value: 724 Ohm
Lead Channel Setting Pacing Amplitude: 1.5 V
Lead Channel Setting Pacing Amplitude: 2.4 V
Lead Channel Setting Pacing Pulse Width: 0.4 ms
Lead Channel Setting Sensing Sensitivity: 2.5 mV
MDC IDC LEAD IMPLANT DT: 20130307
MDC IDC LEAD IMPLANT DT: 20130307
MDC IDC LEAD LOCATION: 753859
MDC IDC LEAD LOCATION: 753860
MDC IDC MSMT LEADCHNL RA IMPEDANCE VALUE: 529 Ohm
MDC IDC MSMT LEADCHNL RV PACING THRESHOLD AMPLITUDE: 1 V
MDC IDC MSMT LEADCHNL RV PACING THRESHOLD PULSEWIDTH: 0.4 ms
MDC IDC PG SERIAL: 126257
MDC IDC SESS DTM: 20170712050100
MDC IDC STAT BRADY RA PERCENT PACED: 95 %

## 2015-12-27 ENCOUNTER — Ambulatory Visit (INDEPENDENT_AMBULATORY_CARE_PROVIDER_SITE_OTHER): Payer: MEDICARE | Admitting: *Deleted

## 2015-12-27 DIAGNOSIS — Z950 Presence of cardiac pacemaker: Secondary | ICD-10-CM

## 2015-12-31 NOTE — Progress Notes (Signed)
Remote pacemaker transmission.   

## 2016-01-05 ENCOUNTER — Encounter: Payer: Self-pay | Admitting: Cardiology

## 2016-01-07 ENCOUNTER — Telehealth: Payer: Self-pay | Admitting: Cardiology

## 2016-01-07 NOTE — Telephone Encounter (Signed)
Pt son called and stated that due to pt age and health it is difficult to bring patient to appointments. He wants to know if pt can be followed remotely from this point forwarded. Informed pt that I would send MD a message and give him a call back with an answer. Pt informed me that the best number to reach him at is [phone]. Pt aware that he may not get a call back today and that I will call as soon as MD gives me an answer.

## 2016-01-10 NOTE — Telephone Encounter (Signed)
LMOVM for pt to return call 

## 2016-01-10 NOTE — Telephone Encounter (Signed)
Remote monitoring will be fine. GT

## 2016-01-11 LAB — CUP PACEART REMOTE DEVICE CHECK
Battery Remaining Longevity: 54 mo
Battery Remaining Percentage: 82 %
Brady Statistic RV Percent Paced: 0 %
Implantable Lead Implant Date: 20130307
Implantable Lead Location: 753859
Implantable Lead Location: 753860
Implantable Lead Model: 4086
Implantable Lead Model: 4087
Implantable Lead Serial Number: 219506
Implantable Pulse Generator Implant Date: 20130307
Lead Channel Impedance Value: 544 Ohm
Lead Channel Pacing Threshold Amplitude: 1.2 V
Lead Channel Pacing Threshold Amplitude: 1.2 V
Lead Channel Setting Pacing Amplitude: 1.7 V
Lead Channel Setting Pacing Amplitude: 2.4 V
Lead Channel Setting Pacing Pulse Width: 0.4 ms
Lead Channel Setting Sensing Sensitivity: 2.5 mV
MDC IDC LEAD IMPLANT DT: 20130307
MDC IDC LEAD SERIAL: 211950
MDC IDC MSMT LEADCHNL RA PACING THRESHOLD PULSEWIDTH: 0.4 ms
MDC IDC MSMT LEADCHNL RV IMPEDANCE VALUE: 709 Ohm
MDC IDC MSMT LEADCHNL RV PACING THRESHOLD PULSEWIDTH: 0.4 ms
MDC IDC PG SERIAL: 126257
MDC IDC SESS DTM: 20171204215500
MDC IDC STAT BRADY RA PERCENT PACED: 95 %

## 2016-01-11 NOTE — Telephone Encounter (Signed)
LMOVM for pt son informing him that MD is ok with pt following up remotely for future appts and that pt next remote will be Monday April 10, 2016.

## 2016-01-26 ENCOUNTER — Encounter: Payer: Self-pay | Admitting: Internal Medicine

## 2016-04-10 ENCOUNTER — Telehealth: Payer: Self-pay | Admitting: Cardiology

## 2016-04-10 ENCOUNTER — Encounter: Payer: MEDICARE | Admitting: *Deleted

## 2016-04-10 NOTE — Telephone Encounter (Signed)
LMOVM reminding pt to send remote transmission.   

## 2016-04-14 ENCOUNTER — Encounter: Payer: Self-pay | Admitting: Cardiology

## 2016-04-24 ENCOUNTER — Telehealth: Payer: Self-pay | Admitting: Cardiology

## 2016-04-24 NOTE — Telephone Encounter (Signed)
Spoke with pts daughter in law and informed her that pts transmission was received.

## 2016-04-24 NOTE — Telephone Encounter (Signed)
New message    1. Has your device fired? no  2. Is you device beeping? No just flashing yellow  3. Are you experiencing draining or swelling at device site? no  4. Are you calling to see if we received your device transmission? yes  5. Have you passed out? no

## 2016-05-11 ENCOUNTER — Ambulatory Visit (INDEPENDENT_AMBULATORY_CARE_PROVIDER_SITE_OTHER): Payer: MEDICARE | Admitting: *Deleted

## 2016-05-11 DIAGNOSIS — I495 Sick sinus syndrome: Secondary | ICD-10-CM

## 2016-05-11 NOTE — Progress Notes (Signed)
Remote pacemaker transmission.   

## 2016-05-12 ENCOUNTER — Encounter: Payer: Self-pay | Admitting: Cardiology

## 2016-05-12 LAB — CUP PACEART REMOTE DEVICE CHECK
Battery Remaining Percentage: 75 %
Brady Statistic RA Percent Paced: 91 %
Brady Statistic RV Percent Paced: 0 %
Date Time Interrogation Session: 20180419050200
Implantable Lead Implant Date: 20130307
Implantable Lead Implant Date: 20130307
Implantable Lead Location: 753860
Implantable Lead Model: 4087
Implantable Lead Serial Number: 211950
Implantable Lead Serial Number: 219506
Implantable Pulse Generator Implant Date: 20130307
Lead Channel Impedance Value: 507 Ohm
Lead Channel Impedance Value: 767 Ohm
Lead Channel Pacing Threshold Amplitude: 1.1 V
Lead Channel Pacing Threshold Amplitude: 1.2 V
Lead Channel Pacing Threshold Pulse Width: 0.4 ms
Lead Channel Setting Pacing Amplitude: 1.6 V
Lead Channel Setting Pacing Pulse Width: 0.4 ms
MDC IDC LEAD LOCATION: 753859
MDC IDC MSMT BATTERY REMAINING LONGEVITY: 48 mo
MDC IDC MSMT LEADCHNL RV PACING THRESHOLD PULSEWIDTH: 0.4 ms
MDC IDC SET LEADCHNL RA PACING AMPLITUDE: 2.4 V
MDC IDC SET LEADCHNL RV SENSING SENSITIVITY: 2.5 mV
Pulse Gen Serial Number: 126257

## 2016-05-30 ENCOUNTER — Encounter: Payer: Self-pay | Admitting: Cardiology

## 2016-08-10 ENCOUNTER — Ambulatory Visit (INDEPENDENT_AMBULATORY_CARE_PROVIDER_SITE_OTHER): Payer: MEDICARE | Admitting: *Deleted

## 2016-08-10 DIAGNOSIS — I495 Sick sinus syndrome: Secondary | ICD-10-CM | POA: Diagnosis not present

## 2016-08-10 LAB — CUP PACEART REMOTE DEVICE CHECK
Battery Remaining Longevity: 42 mo
Battery Remaining Percentage: 70 %
Brady Statistic RA Percent Paced: 88 %
Date Time Interrogation Session: 20180719050200
Implantable Lead Implant Date: 20130307
Implantable Lead Implant Date: 20130307
Implantable Lead Location: 753859
Implantable Lead Model: 4086
Implantable Lead Serial Number: 211950
Implantable Pulse Generator Implant Date: 20130307
Lead Channel Impedance Value: 508 Ohm
Lead Channel Impedance Value: 727 Ohm
Lead Channel Pacing Threshold Pulse Width: 0.4 ms
Lead Channel Pacing Threshold Pulse Width: 0.4 ms
Lead Channel Setting Pacing Amplitude: 1.7 V
Lead Channel Setting Pacing Amplitude: 2.4 V
Lead Channel Setting Pacing Pulse Width: 0.4 ms
Lead Channel Setting Sensing Sensitivity: 2.5 mV
MDC IDC LEAD LOCATION: 753860
MDC IDC LEAD SERIAL: 219506
MDC IDC MSMT LEADCHNL RA PACING THRESHOLD AMPLITUDE: 1.2 V
MDC IDC MSMT LEADCHNL RV PACING THRESHOLD AMPLITUDE: 1.1 V
MDC IDC STAT BRADY RV PERCENT PACED: 0 %
Pulse Gen Serial Number: 126257

## 2016-08-10 NOTE — Progress Notes (Signed)
Remote pacemaker transmission.   

## 2016-08-17 ENCOUNTER — Encounter: Payer: Self-pay | Admitting: Cardiology

## 2016-08-31 ENCOUNTER — Encounter: Payer: Self-pay | Admitting: Cardiology

## 2016-11-09 ENCOUNTER — Ambulatory Visit (INDEPENDENT_AMBULATORY_CARE_PROVIDER_SITE_OTHER): Payer: MEDICARE | Admitting: *Deleted

## 2016-11-09 DIAGNOSIS — I495 Sick sinus syndrome: Secondary | ICD-10-CM | POA: Diagnosis not present

## 2016-11-09 NOTE — Progress Notes (Signed)
Remote pacemaker transmission.   

## 2016-11-16 ENCOUNTER — Encounter: Payer: Self-pay | Admitting: Cardiology

## 2016-11-30 LAB — CUP PACEART REMOTE DEVICE CHECK
Battery Remaining Longevity: 42 mo
Battery Remaining Percentage: 71 %
Date Time Interrogation Session: 20181018050100
Implantable Lead Implant Date: 20130307
Implantable Lead Location: 753859
Implantable Lead Location: 753860
Implantable Lead Serial Number: 211950
Implantable Pulse Generator Implant Date: 20130307
Lead Channel Impedance Value: 529 Ohm
Lead Channel Pacing Threshold Amplitude: 1.2 V
Lead Channel Pacing Threshold Pulse Width: 0.4 ms
Lead Channel Pacing Threshold Pulse Width: 0.4 ms
Lead Channel Setting Pacing Amplitude: 2.4 V
Lead Channel Setting Pacing Pulse Width: 0.4 ms
Lead Channel Setting Sensing Sensitivity: 2.5 mV
MDC IDC LEAD IMPLANT DT: 20130307
MDC IDC LEAD SERIAL: 219506
MDC IDC MSMT LEADCHNL RV IMPEDANCE VALUE: 782 Ohm
MDC IDC MSMT LEADCHNL RV PACING THRESHOLD AMPLITUDE: 1.1 V
MDC IDC SET LEADCHNL RV PACING AMPLITUDE: 1.5 V
MDC IDC STAT BRADY RA PERCENT PACED: 87 %
MDC IDC STAT BRADY RV PERCENT PACED: 0 %
Pulse Gen Serial Number: 126257

## 2017-02-08 ENCOUNTER — Ambulatory Visit (INDEPENDENT_AMBULATORY_CARE_PROVIDER_SITE_OTHER): Payer: MEDICARE | Admitting: *Deleted

## 2017-02-08 DIAGNOSIS — I495 Sick sinus syndrome: Secondary | ICD-10-CM | POA: Diagnosis not present

## 2017-02-08 NOTE — Progress Notes (Signed)
Remote pacemaker transmission.   

## 2017-02-09 ENCOUNTER — Encounter: Payer: Self-pay | Admitting: Cardiology

## 2017-02-21 LAB — CUP PACEART REMOTE DEVICE CHECK
Battery Remaining Longevity: 42 mo
Battery Remaining Percentage: 67 %
Brady Statistic RV Percent Paced: 0 %
Date Time Interrogation Session: 20190117060100
Implantable Lead Implant Date: 20130307
Implantable Lead Implant Date: 20130307
Implantable Lead Location: 753859
Implantable Lead Location: 753860
Implantable Lead Model: 4087
Implantable Lead Serial Number: 211950
Implantable Lead Serial Number: 219506
Implantable Pulse Generator Implant Date: 20130307
Lead Channel Impedance Value: 523 Ohm
Lead Channel Pacing Threshold Amplitude: 1.2 V
Lead Channel Pacing Threshold Pulse Width: 0.4 ms
Lead Channel Setting Pacing Amplitude: 2.4 V
Lead Channel Setting Pacing Pulse Width: 0.4 ms
Lead Channel Setting Sensing Sensitivity: 2.5 mV
MDC IDC MSMT LEADCHNL RV IMPEDANCE VALUE: 755 Ohm
MDC IDC MSMT LEADCHNL RV PACING THRESHOLD AMPLITUDE: 1.2 V
MDC IDC MSMT LEADCHNL RV PACING THRESHOLD PULSEWIDTH: 0.4 ms
MDC IDC SET LEADCHNL RV PACING AMPLITUDE: 1.7 V
MDC IDC STAT BRADY RA PERCENT PACED: 87 %
Pulse Gen Serial Number: 126257

## 2017-05-10 ENCOUNTER — Ambulatory Visit (INDEPENDENT_AMBULATORY_CARE_PROVIDER_SITE_OTHER): Payer: MEDICARE | Admitting: *Deleted

## 2017-05-10 DIAGNOSIS — I495 Sick sinus syndrome: Secondary | ICD-10-CM

## 2017-05-10 NOTE — Progress Notes (Signed)
Remote pacemaker transmission.   

## 2017-05-11 ENCOUNTER — Encounter: Payer: Self-pay | Admitting: Cardiology

## 2017-05-16 LAB — CUP PACEART REMOTE DEVICE CHECK
Battery Remaining Longevity: 42 mo
Brady Statistic RA Percent Paced: 85 %
Brady Statistic RV Percent Paced: 0 %
Implantable Lead Location: 753859
Implantable Lead Location: 753860
Implantable Lead Model: 4086
Implantable Lead Model: 4087
Implantable Lead Serial Number: 211950
Implantable Lead Serial Number: 219506
Lead Channel Impedance Value: 535 Ohm
Lead Channel Impedance Value: 676 Ohm
Lead Channel Pacing Threshold Amplitude: 1.1 V
Lead Channel Pacing Threshold Amplitude: 1.2 V
Lead Channel Pacing Threshold Pulse Width: 0.4 ms
Lead Channel Setting Pacing Amplitude: 1.6 V
Lead Channel Setting Pacing Amplitude: 2.4 V
Lead Channel Setting Sensing Sensitivity: 2.5 mV
MDC IDC LEAD IMPLANT DT: 20130307
MDC IDC LEAD IMPLANT DT: 20130307
MDC IDC MSMT BATTERY REMAINING PERCENTAGE: 65 %
MDC IDC MSMT LEADCHNL RA PACING THRESHOLD PULSEWIDTH: 0.4 ms
MDC IDC PG IMPLANT DT: 20130307
MDC IDC SESS DTM: 20190418050200
MDC IDC SET LEADCHNL RV PACING PULSEWIDTH: 0.4 ms
Pulse Gen Serial Number: 126257

## 2017-08-09 ENCOUNTER — Ambulatory Visit (INDEPENDENT_AMBULATORY_CARE_PROVIDER_SITE_OTHER): Payer: MEDICARE | Admitting: *Deleted

## 2017-08-09 DIAGNOSIS — I495 Sick sinus syndrome: Secondary | ICD-10-CM

## 2017-08-09 NOTE — Progress Notes (Signed)
Remote pacemaker transmission.   

## 2017-08-10 ENCOUNTER — Encounter: Payer: Self-pay | Admitting: Cardiology

## 2017-08-16 LAB — CUP PACEART REMOTE DEVICE CHECK
Battery Remaining Longevity: 42 mo
Battery Remaining Percentage: 63 %
Brady Statistic RA Percent Paced: 82 %
Brady Statistic RV Percent Paced: 0 %
Implantable Lead Implant Date: 20130307
Implantable Lead Location: 753860
Implantable Lead Model: 4086
Implantable Lead Model: 4087
Implantable Lead Serial Number: 219506
Lead Channel Impedance Value: 472 Ohm
Lead Channel Pacing Threshold Amplitude: 1.2 V
Lead Channel Pacing Threshold Amplitude: 1.3 V
Lead Channel Pacing Threshold Pulse Width: 0.4 ms
Lead Channel Setting Pacing Amplitude: 1.8 V
Lead Channel Setting Pacing Amplitude: 2.4 V
MDC IDC LEAD IMPLANT DT: 20130307
MDC IDC LEAD LOCATION: 753859
MDC IDC LEAD SERIAL: 211950
MDC IDC MSMT LEADCHNL RV IMPEDANCE VALUE: 749 Ohm
MDC IDC MSMT LEADCHNL RV PACING THRESHOLD PULSEWIDTH: 0.4 ms
MDC IDC PG IMPLANT DT: 20130307
MDC IDC SESS DTM: 20190718050200
MDC IDC SET LEADCHNL RV PACING PULSEWIDTH: 0.4 ms
MDC IDC SET LEADCHNL RV SENSING SENSITIVITY: 2.5 mV
Pulse Gen Serial Number: 126257

## 2017-09-12 ENCOUNTER — Telehealth: Payer: Self-pay | Admitting: Cardiology

## 2017-09-23 NOTE — Telephone Encounter (Signed)
Spoke with pt's son informed him that the pacemaker will not keep her heart beating. Explained that the heart need to pump as well as electricity in order to sustain life and that once the pumping function of the heart gives out the pacemaker will have no effect. Pts son voiced understanding.

## 2017-09-23 NOTE — Telephone Encounter (Signed)
Patient son called and stated that the nursing facility patient is at called the family in b/c they are not sure if patient will make it through the day. Patient son had questions about PPM function. Called routed to Device [employer] RN.

## 2017-09-23 DEATH — deceased

## 2017-11-08 ENCOUNTER — Encounter: Payer: Medicaid Other | Admitting: *Deleted
